# Patient Record
Sex: Male | Born: 1965 | Race: Black or African American | Hispanic: No | Marital: Married | Smoking: Current every day smoker
Health system: Southern US, Community
[De-identification: ages and names within clinical notes are randomized; demographics above are authoritative.]

## PROBLEM LIST (undated history)

## (undated) DIAGNOSIS — E7800 Pure hypercholesterolemia, unspecified: Secondary | ICD-10-CM

## (undated) DIAGNOSIS — F17200 Nicotine dependence, unspecified, uncomplicated: Secondary | ICD-10-CM

## (undated) HISTORY — DX: Nicotine dependence, unspecified, uncomplicated: F17.200

## (undated) HISTORY — PX: APPENDECTOMY: SHX54

## (undated) HISTORY — PX: JOINT REPLACEMENT: SHX530

---

## 2000-11-05 ENCOUNTER — Encounter: Payer: Self-pay | Admitting: Emergency Medicine

## 2000-11-05 ENCOUNTER — Observation Stay (HOSPITAL_COMMUNITY): Admission: AC | Admit: 2000-11-05 | Discharge: 2000-11-06 | Payer: Self-pay | Admitting: *Deleted

## 2001-04-24 ENCOUNTER — Encounter: Payer: Self-pay | Admitting: Orthopedic Surgery

## 2001-04-30 ENCOUNTER — Inpatient Hospital Stay (HOSPITAL_COMMUNITY): Admission: RE | Admit: 2001-04-30 | Discharge: 2001-05-02 | Payer: Self-pay | Admitting: Orthopedic Surgery

## 2016-03-25 ENCOUNTER — Other Ambulatory Visit (HOSPITAL_COMMUNITY): Payer: Self-pay | Admitting: Orthopedic Surgery

## 2016-03-25 DIAGNOSIS — M25552 Pain in left hip: Secondary | ICD-10-CM

## 2016-04-13 ENCOUNTER — Encounter (HOSPITAL_COMMUNITY): Payer: 59

## 2017-09-12 ENCOUNTER — Other Ambulatory Visit: Payer: Self-pay | Admitting: Orthopedic Surgery

## 2017-09-26 NOTE — Pre-Procedure Instructions (Signed)
Peter Ramsey  09/26/2017      [id number] - [place], [address] AT [place] [address] Phone: [phone] Fax: [phone]    Your procedure is scheduled on October 05, 2017.  Report to [facility] Admitting at 745 AM.  Call this number if you have problems the morning of surgery:  [phone]   Remember:  Do not eat or drink after midnight.    Take these medicines the morning of surgery with A SIP OF WATER  Tramadol (ultram)- if needed  7 days prior to surgery STOP taking any meloxicam (mobic) , Aspirin (unless otherwise instructed by your surgeon), Aleve, Naproxen, Ibuprofen, Motrin, Advil, Goody's, BC's, all herbal medications, fish oil, and all vitamins    Do not wear jewelry  Do not wear lotions, powders, or colognes, or deodorant.  Men may shave face and neck.  Do not bring valuables to the hospital.  [facility] is not responsible for any belongings or valuables.  Contacts, dentures or bridgework may not be worn into surgery.  Leave your suitcase in the car.  After surgery it may be brought to your room.  For patients admitted to the hospital, discharge time will be determined by your treatment team.  Patients discharged the day of surgery will not be allowed to drive home.    [facility]- Preparing For Surgery  Before surgery, you can play an important role. Because skin is not sterile, your skin needs to be as free of germs as possible. You can reduce the number of germs on your skin by washing with CHG (chlorahexidine gluconate) Soap before surgery.  CHG is an antiseptic cleaner which kills germs and bonds with the skin to continue killing germs even after washing.    Oral Hygiene is also important to reduce your risk of infection.  Remember - BRUSH YOUR TEETH THE MORNING OF SURGERY WITH YOUR REGULAR TOOTHPASTE  Please do not use if you have an allergy to  CHG or antibacterial soaps. If your skin becomes reddened/irritated stop using the CHG.  Do not shave (including legs and underarms) for at least 48 hours prior to first CHG shower. It is OK to shave your face.  Please follow these instructions carefully.   1. Shower the NIGHT BEFORE SURGERY and the MORNING OF SURGERY with CHG.   2. If you chose to wash your hair, wash your hair first as usual with your normal shampoo.  3. After you shampoo, rinse your hair and body thoroughly to remove the shampoo.  4. Use CHG as you would any other liquid soap. You can apply CHG directly to the skin and wash gently with a scrungie or a clean washcloth.   5. Apply the CHG Soap to your body ONLY FROM THE NECK DOWN.  Do not use on open wounds or open sores. Avoid contact with your eyes, ears, mouth and genitals (private parts). Wash Face and genitals (private parts)  with your normal soap.  6. Wash thoroughly, paying special attention to the area where your surgery will be performed.  7. Thoroughly rinse your body with warm water from the neck down.  8. DO NOT shower/wash with your normal soap after using and rinsing off the CHG Soap.  9. Pat yourself dry with a CLEAN TOWEL.  10. Wear CLEAN PAJAMAS to bed the night before surgery, wear comfortable clothes the morning of surgery  11. Place  CLEAN SHEETS on your bed the night of your first shower and DO NOT SLEEP WITH PETS.  Day of Surgery:  Do not apply any deodorants/lotions.  Please wear clean clothes to the hospital/surgery center.   Remember to brush your teeth WITH YOUR REGULAR TOOTHPASTE.   Please read over the following fact sheets that you were given.

## 2017-09-26 NOTE — Progress Notes (Addendum)
PCP: denies  Cardiologist: denies  EKG: denies within past year  Stress test:denies  ECHO: denies  Cardiac Cath: denies  Chest x-ray: denies within past year

## 2017-09-27 ENCOUNTER — Other Ambulatory Visit: Payer: Self-pay

## 2017-09-27 ENCOUNTER — Encounter (HOSPITAL_COMMUNITY)
Admission: RE | Admit: 2017-09-27 | Discharge: 2017-09-27 | Disposition: A | Discharge status: Home / Self Care | Payer: 59 | Source: Ambulatory Visit | Attending: Orthopedic Surgery | Admitting: Orthopedic Surgery

## 2017-09-27 ENCOUNTER — Encounter (HOSPITAL_COMMUNITY): Payer: Self-pay

## 2017-09-27 DIAGNOSIS — Z01818 Encounter for other preprocedural examination: Secondary | ICD-10-CM

## 2017-09-27 DIAGNOSIS — F17200 Nicotine dependence, unspecified, uncomplicated: Secondary | ICD-10-CM | POA: Diagnosis not present

## 2017-09-27 DIAGNOSIS — Z01812 Encounter for preprocedural laboratory examination: Secondary | ICD-10-CM | POA: Diagnosis present

## 2017-09-27 DIAGNOSIS — Z01810 Encounter for preprocedural cardiovascular examination: Secondary | ICD-10-CM | POA: Diagnosis present

## 2017-09-27 DIAGNOSIS — R9431 Abnormal electrocardiogram [ECG] [EKG]: Secondary | ICD-10-CM | POA: Diagnosis not present

## 2017-09-27 HISTORY — DX: Pure hypercholesterolemia, unspecified: E78.00

## 2017-09-27 LAB — CBC WITH DIFFERENTIAL/PLATELET
Abs Immature Granulocytes: 0 10*3/uL (ref 0.0–0.1)
BASOS ABS: 0 10*3/uL (ref 0.0–0.1)
BASOS PCT: 0 %
EOS PCT: 2 %
Eosinophils Absolute: 0.1 10*3/uL (ref 0.0–0.7)
HCT: 37.8 % — ABNORMAL LOW (ref 39.0–52.0)
Hemoglobin: 12.8 g/dL — ABNORMAL LOW (ref 13.0–17.0)
Immature Granulocytes: 0 %
Lymphocytes Relative: 32 %
Lymphs Abs: 1.5 10*3/uL (ref 0.7–4.0)
MCH: 34.5 pg — AB (ref 26.0–34.0)
MCHC: 33.9 g/dL (ref 30.0–36.0)
MCV: 101.9 fL — ABNORMAL HIGH (ref 78.0–100.0)
Monocytes Absolute: 0.4 10*3/uL (ref 0.1–1.0)
Monocytes Relative: 8 %
Neutro Abs: 2.7 10*3/uL (ref 1.7–7.7)
Neutrophils Relative %: 58 %
PLATELETS: 278 10*3/uL (ref 150–400)
RBC: 3.71 MIL/uL — ABNORMAL LOW (ref 4.22–5.81)
RDW: 11.1 % — ABNORMAL LOW (ref 11.5–15.5)
WBC: 4.6 10*3/uL (ref 4.0–10.5)

## 2017-09-27 LAB — TYPE AND SCREEN
ABO/RH(D): AB POS
Antibody Screen: NEGATIVE

## 2017-09-27 LAB — COMPREHENSIVE METABOLIC PANEL
ALT: 19 U/L (ref 0–44)
ANION GAP: 12 (ref 5–15)
AST: 31 U/L (ref 15–41)
Albumin: 4.3 g/dL (ref 3.5–5.0)
Alkaline Phosphatase: 83 U/L (ref 38–126)
BUN: 5 mg/dL — ABNORMAL LOW (ref 6–20)
CHLORIDE: 102 mmol/L (ref 98–111)
CO2: 23 mmol/L (ref 22–32)
Calcium: 9.9 mg/dL (ref 8.9–10.3)
Creatinine, Ser: 0.92 mg/dL (ref 0.61–1.24)
GFR calc non Af Amer: >60 mL/min (ref >60)
Glucose, Bld: 95 mg/dL (ref 70–99)
Potassium: 4.1 mmol/L (ref 3.5–5.1)
SODIUM: 137 mmol/L (ref 135–145)
Total Bilirubin: 0.7 mg/dL (ref 0.3–1.2)
Total Protein: 7.4 g/dL (ref 6.5–8.1)

## 2017-09-27 LAB — URINALYSIS, ROUTINE W REFLEX MICROSCOPIC
Bilirubin Urine: NEGATIVE
GLUCOSE, UA: NEGATIVE mg/dL
HGB URINE DIPSTICK: NEGATIVE
Ketones, ur: NEGATIVE mg/dL
Leukocytes, UA: NEGATIVE
Nitrite: NEGATIVE
PH: 5 (ref 5.0–8.0)
PROTEIN: NEGATIVE mg/dL
SPECIFIC GRAVITY, URINE: 1.009 (ref 1.005–1.030)

## 2017-09-27 LAB — PROTIME-INR
INR: 0.94
PROTHROMBIN TIME: 12.5 s (ref 11.4–15.2)

## 2017-09-27 LAB — SURGICAL PCR SCREEN
MRSA, PCR: NEGATIVE
STAPHYLOCOCCUS AUREUS: NEGATIVE

## 2017-09-27 LAB — ABO/RH: ABO/RH(D): AB POS

## 2017-09-27 LAB — APTT: aPTT: 28 seconds (ref 24–36)

## 2017-10-04 MED ORDER — TRANEXAMIC ACID 1000 MG/10ML IV SOLN
1000.0000 mg | INTRAVENOUS | Status: AC
  Administered 2017-10-05: 1000 mg via INTRAVENOUS
  Filled 2017-10-04: qty 1000

## 2017-10-05 ENCOUNTER — Inpatient Hospital Stay (HOSPITAL_COMMUNITY)
Admission: RE | Admit: 2017-10-05 | Discharge: 2017-10-06 | DRG: 483 | Disposition: A | Discharge status: Home / Self Care | Payer: 59 | Source: Ambulatory Visit | Attending: Orthopedic Surgery | Admitting: Orthopedic Surgery

## 2017-10-05 ENCOUNTER — Other Ambulatory Visit: Payer: Self-pay

## 2017-10-05 ENCOUNTER — Encounter (HOSPITAL_COMMUNITY): Payer: Self-pay | Admitting: Anesthesiology

## 2017-10-05 ENCOUNTER — Inpatient Hospital Stay (HOSPITAL_COMMUNITY): Payer: 59 | Admitting: Anesthesiology

## 2017-10-05 ENCOUNTER — Inpatient Hospital Stay (HOSPITAL_COMMUNITY): Payer: 59

## 2017-10-05 ENCOUNTER — Encounter (HOSPITAL_COMMUNITY)
Admission: RE | Disposition: A | Discharge status: Home / Self Care | Payer: Self-pay | Source: Ambulatory Visit | Attending: Orthopedic Surgery

## 2017-10-05 DIAGNOSIS — Z791 Long term (current) use of non-steroidal anti-inflammatories (NSAID): Secondary | ICD-10-CM | POA: Diagnosis not present

## 2017-10-05 DIAGNOSIS — F17210 Nicotine dependence, cigarettes, uncomplicated: Secondary | ICD-10-CM | POA: Diagnosis present

## 2017-10-05 DIAGNOSIS — M25512 Pain in left shoulder: Secondary | ICD-10-CM | POA: Diagnosis not present

## 2017-10-05 DIAGNOSIS — Z96612 Presence of left artificial shoulder joint: Secondary | ICD-10-CM

## 2017-10-05 DIAGNOSIS — E7800 Pure hypercholesterolemia, unspecified: Secondary | ICD-10-CM | POA: Diagnosis present

## 2017-10-05 DIAGNOSIS — M879 Osteonecrosis, unspecified: Secondary | ICD-10-CM | POA: Diagnosis not present

## 2017-10-05 HISTORY — PX: SHOULDER HEMI-ARTHROPLASTY: SHX5049

## 2017-10-05 SURGERY — HEMIARTHROPLASTY, SHOULDER
Anesthesia: General | Site: Shoulder | Laterality: Left

## 2017-10-05 MED ORDER — CEFAZOLIN SODIUM-DEXTROSE 2-4 GM/100ML-% IV SOLN
2.0000 g | Freq: Four times a day (QID) | INTRAVENOUS | Status: AC
  Administered 2017-10-05 – 2017-10-06 (×3): 2 g via INTRAVENOUS
  Filled 2017-10-05 (×4): qty 100

## 2017-10-05 MED ORDER — MENTHOL 3 MG MT LOZG: 1.0000 | LOZENGE | OROMUCOSAL | Status: DC | PRN

## 2017-10-05 MED ORDER — SUCCINYLCHOLINE CHLORIDE 20 MG/ML IJ SOLN
INTRAMUSCULAR | Status: DC | PRN
  Administered 2017-10-05: 100 mg via INTRAVENOUS

## 2017-10-05 MED ORDER — BUPIVACAINE LIPOSOME 1.3 % IJ SUSP
INTRAMUSCULAR | Status: DC | PRN
  Administered 2017-10-05: 10 mL via PERINEURAL

## 2017-10-05 MED ORDER — OXYCODONE HCL 5 MG/5ML PO SOLN: 5.0000 mg | Freq: Once | ORAL | Status: DC | PRN

## 2017-10-05 MED ORDER — ONDANSETRON HCL 4 MG/2ML IJ SOLN
INTRAMUSCULAR | Status: AC
  Filled 2017-10-05: qty 2

## 2017-10-05 MED ORDER — MEPERIDINE HCL 50 MG/ML IJ SOLN: 6.2500 mg | INTRAMUSCULAR | Status: DC | PRN

## 2017-10-05 MED ORDER — POLYETHYLENE GLYCOL 3350 17 G PO PACK: 17.0000 g | PACK | Freq: Every day | ORAL | Status: DC | PRN

## 2017-10-05 MED ORDER — DOCUSATE SODIUM 100 MG PO CAPS
100.0000 mg | ORAL_CAPSULE | Freq: Two times a day (BID) | ORAL | Status: DC
  Administered 2017-10-05 – 2017-10-06 (×3): 100 mg via ORAL
  Filled 2017-10-05 (×3): qty 1

## 2017-10-05 MED ORDER — METHOCARBAMOL 500 MG PO TABS
500.0000 mg | ORAL_TABLET | Freq: Four times a day (QID) | ORAL | Status: DC | PRN
  Administered 2017-10-05: 500 mg via ORAL
  Filled 2017-10-05: qty 1

## 2017-10-05 MED ORDER — METHOCARBAMOL 1000 MG/10ML IJ SOLN
500.0000 mg | Freq: Four times a day (QID) | INTRAVENOUS | Status: DC | PRN
  Filled 2017-10-05: qty 5

## 2017-10-05 MED ORDER — PROMETHAZINE HCL 25 MG/ML IJ SOLN: 6.2500 mg | INTRAMUSCULAR | Status: DC | PRN

## 2017-10-05 MED ORDER — DEXAMETHASONE SODIUM PHOSPHATE 4 MG/ML IJ SOLN
INTRAMUSCULAR | Status: DC | PRN
  Administered 2017-10-05: 10 mg via INTRAVENOUS

## 2017-10-05 MED ORDER — PHENOL 1.4 % MT LIQD: 1.0000 | OROMUCOSAL | Status: DC | PRN

## 2017-10-05 MED ORDER — MIDAZOLAM HCL 2 MG/2ML IJ SOLN
INTRAMUSCULAR | Status: AC
  Administered 2017-10-05: 2 mg via INTRAVENOUS
  Filled 2017-10-05: qty 2

## 2017-10-05 MED ORDER — LACTATED RINGERS IV SOLN
INTRAVENOUS | Status: DC
  Administered 2017-10-05: 08:00:00 via INTRAVENOUS

## 2017-10-05 MED ORDER — METOCLOPRAMIDE HCL 5 MG/ML IJ SOLN: 5.0000 mg | Freq: Three times a day (TID) | INTRAMUSCULAR | Status: DC | PRN

## 2017-10-05 MED ORDER — ASPIRIN EC 81 MG PO TBEC
81.0000 mg | DELAYED_RELEASE_TABLET | Freq: Two times a day (BID) | ORAL | Status: DC
  Administered 2017-10-05 – 2017-10-06 (×2): 81 mg via ORAL
  Filled 2017-10-05 (×2): qty 1

## 2017-10-05 MED ORDER — EPHEDRINE SULFATE 50 MG/ML IJ SOLN
INTRAMUSCULAR | Status: DC | PRN
  Administered 2017-10-05 (×2): 5 mg via INTRAVENOUS

## 2017-10-05 MED ORDER — ACETAMINOPHEN 500 MG PO TABS
1000.0000 mg | ORAL_TABLET | Freq: Four times a day (QID) | ORAL | Status: AC
  Administered 2017-10-05 – 2017-10-06 (×4): 1000 mg via ORAL
  Filled 2017-10-05 (×4): qty 2

## 2017-10-05 MED ORDER — OXYCODONE HCL 5 MG PO TABS: 5.0000 mg | ORAL_TABLET | Freq: Once | ORAL | Status: DC | PRN

## 2017-10-05 MED ORDER — HYDROMORPHONE HCL 1 MG/ML IJ SOLN
0.5000 mg | INTRAMUSCULAR | Status: DC | PRN
  Administered 2017-10-05: 1 mg via INTRAVENOUS
  Filled 2017-10-05: qty 1

## 2017-10-05 MED ORDER — PROPOFOL 10 MG/ML IV BOLUS
INTRAVENOUS | Status: DC | PRN
  Administered 2017-10-05: 200 mg via INTRAVENOUS

## 2017-10-05 MED ORDER — SODIUM CHLORIDE 0.9 % IV SOLN
INTRAVENOUS | Status: DC
  Administered 2017-10-05: 14:00:00 via INTRAVENOUS

## 2017-10-05 MED ORDER — FENTANYL CITRATE (PF) 100 MCG/2ML IJ SOLN
100.0000 ug | Freq: Once | INTRAMUSCULAR | Status: AC
  Administered 2017-10-05: 100 ug via INTRAVENOUS

## 2017-10-05 MED ORDER — OXYCODONE HCL 5 MG PO TABS: 10.0000 mg | ORAL_TABLET | ORAL | Status: DC | PRN

## 2017-10-05 MED ORDER — PHENYLEPHRINE HCL 10 MG/ML IJ SOLN
INTRAMUSCULAR | Status: DC | PRN
  Administered 2017-10-05 (×3): 80 ug via INTRAVENOUS

## 2017-10-05 MED ORDER — HYDROMORPHONE HCL 1 MG/ML IJ SOLN: 0.2500 mg | INTRAMUSCULAR | Status: DC | PRN

## 2017-10-05 MED ORDER — OXYCODONE HCL 5 MG PO TABS
5.0000 mg | ORAL_TABLET | ORAL | Status: DC | PRN
  Administered 2017-10-05 – 2017-10-06 (×3): 10 mg via ORAL
  Filled 2017-10-05 (×3): qty 2

## 2017-10-05 MED ORDER — ALUM & MAG HYDROXIDE-SIMETH 200-200-20 MG/5ML PO SUSP: 30.0000 mL | ORAL | Status: DC | PRN

## 2017-10-05 MED ORDER — POVIDONE-IODINE 7.5 % EX SOLN
Freq: Once | CUTANEOUS | Status: DC
  Filled 2017-10-05: qty 118

## 2017-10-05 MED ORDER — METOCLOPRAMIDE HCL 5 MG PO TABS: 5.0000 mg | ORAL_TABLET | Freq: Three times a day (TID) | ORAL | Status: DC | PRN

## 2017-10-05 MED ORDER — ONDANSETRON HCL 4 MG/2ML IJ SOLN: 4.0000 mg | Freq: Four times a day (QID) | INTRAMUSCULAR | Status: DC | PRN

## 2017-10-05 MED ORDER — SODIUM CHLORIDE 0.9 % IR SOLN
Status: DC | PRN
  Administered 2017-10-05: 1000 mL

## 2017-10-05 MED ORDER — ONDANSETRON HCL 4 MG PO TABS: 4.0000 mg | ORAL_TABLET | Freq: Four times a day (QID) | ORAL | Status: DC | PRN

## 2017-10-05 MED ORDER — FENTANYL CITRATE (PF) 100 MCG/2ML IJ SOLN
INTRAMUSCULAR | Status: AC
  Administered 2017-10-05: 100 ug via INTRAVENOUS
  Filled 2017-10-05: qty 2

## 2017-10-05 MED ORDER — BISACODYL 5 MG PO TBEC
5.0000 mg | DELAYED_RELEASE_TABLET | Freq: Every day | ORAL | Status: DC | PRN
  Filled 2017-10-05: qty 1

## 2017-10-05 MED ORDER — ACETAMINOPHEN 325 MG PO TABS: 325.0000 mg | ORAL_TABLET | Freq: Four times a day (QID) | ORAL | Status: DC | PRN

## 2017-10-05 MED ORDER — MIDAZOLAM HCL 2 MG/2ML IJ SOLN
2.0000 mg | Freq: Once | INTRAMUSCULAR | Status: AC
  Administered 2017-10-05: 2 mg via INTRAVENOUS

## 2017-10-05 MED ORDER — DIPHENHYDRAMINE HCL 12.5 MG/5ML PO ELIX: 12.5000 mg | ORAL_SOLUTION | ORAL | Status: DC | PRN

## 2017-10-05 MED ORDER — ONDANSETRON HCL 4 MG/2ML IJ SOLN
INTRAMUSCULAR | Status: DC | PRN
  Administered 2017-10-05: 4 mg via INTRAVENOUS

## 2017-10-05 MED ORDER — ZOLPIDEM TARTRATE 5 MG PO TABS: 5.0000 mg | ORAL_TABLET | Freq: Every evening | ORAL | Status: DC | PRN

## 2017-10-05 MED ORDER — FLEET ENEMA 7-19 GM/118ML RE ENEM: 1.0000 | ENEMA | Freq: Once | RECTAL | Status: DC | PRN

## 2017-10-05 MED ORDER — LIDOCAINE 2% (20 MG/ML) 5 ML SYRINGE
INTRAMUSCULAR | Status: DC | PRN
  Administered 2017-10-05: 60 mg via INTRAVENOUS

## 2017-10-05 MED ORDER — BUPIVACAINE HCL (PF) 0.5 % IJ SOLN
INTRAMUSCULAR | Status: DC | PRN
  Administered 2017-10-05: 20 mL via PERINEURAL

## 2017-10-05 MED ORDER — CEFAZOLIN SODIUM-DEXTROSE 2-4 GM/100ML-% IV SOLN
2.0000 g | INTRAVENOUS | Status: AC
  Administered 2017-10-05: 2 g via INTRAVENOUS
  Filled 2017-10-05: qty 100

## 2017-10-05 MED ORDER — DEXAMETHASONE SODIUM PHOSPHATE 10 MG/ML IJ SOLN
INTRAMUSCULAR | Status: AC
  Filled 2017-10-05: qty 1

## 2017-10-05 SURGICAL SUPPLY — 68 items
BLADE SAW SGTL 73X25 THK (BLADE) IMPLANT
BLADE SURG 15 STRL LF DISP TIS (BLADE) ×2 IMPLANT
BLADE SURG 15 STRL SS (BLADE) ×4
BOWL SMART MIX CTS (DISPOSABLE) IMPLANT
CHLORAPREP W/TINT 26ML (MISCELLANEOUS) ×4 IMPLANT
COVER SURGICAL LIGHT HANDLE (MISCELLANEOUS) ×2 IMPLANT
DRAPE IMP U-DRAPE 54X76 (DRAPES) ×4 IMPLANT
DRAPE INCISE IOBAN 66X45 STRL (DRAPES) ×2 IMPLANT
DRAPE ORTHO SPLIT 77X108 STRL (DRAPES) ×4
DRAPE SURG 17X23 STRL (DRAPES) ×2 IMPLANT
DRAPE SURG ORHT 6 SPLT 77X108 (DRAPES) ×2 IMPLANT
DRAPE U-SHAPE 47X51 STRL (DRAPES) ×2 IMPLANT
DRSG MEPILEX BORDER 4X4 (GAUZE/BANDAGES/DRESSINGS) ×2 IMPLANT
DRSG MEPILEX BORDER 4X8 (GAUZE/BANDAGES/DRESSINGS) ×2 IMPLANT
DRSG PAD ABDOMINAL 8X10 ST (GAUZE/BANDAGES/DRESSINGS) IMPLANT
ELECT BLADE 4.0 EZ CLEAN MEGAD (MISCELLANEOUS)
ELECT CAUTERY BLADE 6.4 (BLADE) ×2 IMPLANT
ELECT REM PT RETURN 9FT ADLT (ELECTROSURGICAL) ×2
ELECTRODE BLDE 4.0 EZ CLN MEGD (MISCELLANEOUS) IMPLANT
ELECTRODE REM PT RTRN 9FT ADLT (ELECTROSURGICAL) ×1 IMPLANT
EVACUATOR 1/8 PVC DRAIN (DRAIN) ×2 IMPLANT
GAUZE SPONGE 4X4 12PLY STRL (GAUZE/BANDAGES/DRESSINGS) ×2 IMPLANT
GLOVE BIO SURGEON STRL SZ7 (GLOVE) ×2 IMPLANT
GLOVE BIO SURGEON STRL SZ7.5 (GLOVE) ×2 IMPLANT
GLOVE BIOGEL PI IND STRL 8 (GLOVE) ×1 IMPLANT
GLOVE BIOGEL PI INDICATOR 8 (GLOVE) ×1
GOWN STRL REUS W/ TWL LRG LVL3 (GOWN DISPOSABLE) ×3 IMPLANT
GOWN STRL REUS W/TWL LRG LVL3 (GOWN DISPOSABLE) ×6
HANDPIECE INTERPULSE COAX TIP (DISPOSABLE) ×2
HEAD HUMERAL AEQUALIS 48X18 (Head) ×2 IMPLANT
HEAD HUMERAL HIGH OS 48X18 (Head) IMPLANT
HEMOSTAT SURGICEL 2X14 (HEMOSTASIS) IMPLANT
HOOD PEEL AWAY FACE SHEILD DIS (HOOD) ×4 IMPLANT
HUMERAL STEM AEQUALIS 3X74 (Shoulder) ×2 IMPLANT
KIT BASIN OR (CUSTOM PROCEDURE TRAY) ×2 IMPLANT
KIT TURNOVER KIT B (KITS) ×2 IMPLANT
MANIFOLD NEPTUNE II (INSTRUMENTS) ×2 IMPLANT
NDL HYPO 25GX1X1/2 BEV (NEEDLE) IMPLANT
NDL MAYO TROCAR (NEEDLE) ×1 IMPLANT
NEEDLE HYPO 25GX1X1/2 BEV (NEEDLE) IMPLANT
NEEDLE MAYO TROCAR (NEEDLE) ×2 IMPLANT
NS IRRIG 1000ML POUR BTL (IV SOLUTION) ×2 IMPLANT
PACK SHOULDER (CUSTOM PROCEDURE TRAY) ×2 IMPLANT
PACK UNIVERSAL I (CUSTOM PROCEDURE TRAY) ×2 IMPLANT
PAD ARMBOARD 7.5X6 YLW CONV (MISCELLANEOUS) ×4 IMPLANT
RETRIEVER SUT HEWSON (MISCELLANEOUS) IMPLANT
SET HNDPC FAN SPRY TIP SCT (DISPOSABLE) ×1 IMPLANT
SLING ARM IMMOBILIZER LRG (SOFTGOODS) ×2 IMPLANT
SLING ARM IMMOBILIZER MED (SOFTGOODS) IMPLANT
SPONGE LAP 18X18 X RAY DECT (DISPOSABLE) ×2 IMPLANT
STEM HUMERAL AEQUALIS 3X74 (Shoulder) IMPLANT
STRIP CLOSURE SKIN 1/2X4 (GAUZE/BANDAGES/DRESSINGS) ×2 IMPLANT
SUCTION FRAZIER HANDLE 10FR (MISCELLANEOUS) ×1
SUCTION TUBE FRAZIER 10FR DISP (MISCELLANEOUS) ×1 IMPLANT
SUPPORT WRAP ARM LG (MISCELLANEOUS) ×2 IMPLANT
SUT ETHIBOND NAB CT1 #1 30IN (SUTURE) ×8 IMPLANT
SUT FIBERWIRE #2 38 T-5 BLUE (SUTURE) ×4
SUT MNCRL AB 4-0 PS2 18 (SUTURE) ×2 IMPLANT
SUT VIC AB 0 CTB1 27 (SUTURE) IMPLANT
SUT VIC AB 2-0 CT1 27 (SUTURE) ×4
SUT VIC AB 2-0 CT1 TAPERPNT 27 (SUTURE) ×2 IMPLANT
SUTURE FIBERWR #2 38 T-5 BLUE (SUTURE) ×2 IMPLANT
SYR CONTROL 10ML LL (SYRINGE) IMPLANT
TAPE FIBER 2MM 7IN #2 BLUE (SUTURE) ×6 IMPLANT
TOWEL OR 17X24 6PK STRL BLUE (TOWEL DISPOSABLE) ×2 IMPLANT
TOWEL OR 17X26 10 PK STRL BLUE (TOWEL DISPOSABLE) ×2 IMPLANT
TRAY FOLEY MTR SLVR 16FR STAT (SET/KITS/TRAYS/PACK) IMPLANT
WATER STERILE IRR 1000ML POUR (IV SOLUTION) ×2 IMPLANT

## 2017-10-05 NOTE — H&P (Signed)
Peter Ramsey is an 52 y.o. male.   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  Pain interferes with sleep and quality of life.   Past Medical History:  Diagnosis Date  . Hypercholesteremia     Past Surgical History:  Procedure Laterality Date  . APPENDECTOMY     as a child  . JOINT REPLACEMENT Left    2002 Left Hip    History reviewed. No pertinent family history. Social History:  reports that he has been smoking cigarettes. He has been smoking about 0.25 packs per day. He has never used smokeless tobacco. He reports that he drinks about 1.0 - 2.0 standard drinks of alcohol per week. He reports that he has current or past drug history. Drug: Marijuana.  Allergies: No Known Allergies  Medications Prior to Admission  Medication Sig Dispense Refill  . Cholecalciferol (VITAMIN D3) 2000 units TABS Take 2,000 Units by mouth daily.    . meloxicam (MOBIC) 15 MG tablet Take 7.5-15 mg by mouth daily as needed (for inflammation).   6  . Multiple Vitamin (ONE-A-DAY MENS PO) Take 1 tablet by mouth daily.    . traMADol (ULTRAM) 50 MG tablet Take 50 mg by mouth every 6 (six) hours as needed for moderate pain.  3    No results found for this or any previous visit (from the past 48 hour(s)). No results found.  Review of Systems  All other systems reviewed and are negative.   Blood pressure 114/89, pulse 88, temperature 98.7 F (37.1 C), temperature source Oral, resp. rate 13, height 6\' 5"  (1.956 m), weight 65.5 kg, SpO2 100 %. Physical Exam  Constitutional: He is oriented to person, place, and time. He appears well-developed and well-nourished.  HENT:  Head: Atraumatic.  Eyes: EOM are normal.  Cardiovascular: Intact distal pulses.  Respiratory: Effort normal.  Musculoskeletal:  L shoulder pain with limited ROM.NVID  Neurological: He is alert and oriented to person, place, and time.  Skin: Skin  is warm and dry.  Psychiatric: He has a normal mood and affect.     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  Pain interferes with sleep and quality of life. Plan L shoulder hemiarthoplasty Risks / benefits of surgery discussed Consent on chart  NPO for OR Preop antibiotics   Isabella Stalling, MD 10/05/2017, 9:12 AM

## 2017-10-05 NOTE — Anesthesia Procedure Notes (Signed)
Procedure Name: Intubation Date/Time: 10/05/2017 9:52 AM Performed by: Lieutenant Diego, CRNA Pre-anesthesia Checklist: Patient identified, Emergency Drugs available, Suction available, Patient being monitored and Timeout performed Patient Re-evaluated:Patient Re-evaluated prior to induction Oxygen Delivery Method: Circle system utilized Preoxygenation: Pre-oxygenation with 100% oxygen Induction Type: IV induction Ventilation: Mask ventilation without difficulty Laryngoscope Size: Miller and 3 Grade View: Grade I Tube type: Oral Tube size: 7.5 mm Number of attempts: 1 Airway Equipment and Method: Stylet Placement Confirmation: ETT inserted through vocal cords under direct vision,  positive ETCO2,  CO2 detector and breath sounds checked- equal and bilateral Secured at: 23 cm Tube secured with: Tape

## 2017-10-05 NOTE — Transfer of Care (Signed)
Immediate Anesthesia Transfer of Care Note  Patient: Peter Ramsey  Procedure(s) Performed: SHOULDER HEMI-ARTHROPLASTY (Left Shoulder)  Patient Location: PACU  Anesthesia Type:GA combined with regional for post-op pain  Level of Consciousness: awake, oriented and patient cooperative  Airway & Oxygen Therapy: Patient Spontanous Breathing and Patient connected to nasal cannula oxygen  Post-op Assessment: Report given to RN, Post -op Vital signs reviewed and stable and Patient moving all extremities  Post vital signs: Reviewed and stable  Last Vitals:  Vitals Value Taken Time  BP 137/82 10/05/2017 11:40 AM  Temp    Pulse 82 10/05/2017 11:41 AM  Resp 20 10/05/2017 11:41 AM  SpO2 100 % 10/05/2017 11:41 AM  Vitals shown include unvalidated device data.  Last Pain:  Vitals:   10/05/17 0900  TempSrc:   PainSc: 0-No pain         Complications: No apparent anesthesia complications

## 2017-10-05 NOTE — Progress Notes (Signed)
Transfer from PACU  Pt received from PACU alert and oriented. Pt sitting in recliner. Pt states his pain is a 2/10 and he is comfotable with that. Pt states he wants his pain to remain below 5 with a pain goal of 0. Pt instructed to call for pain medication when his pain reaches a 5. Pt explained that his pain goal may take a few days to get too, but he will be comfortable.   Orders released. Will monitor pt for clinical changes.

## 2017-10-05 NOTE — Anesthesia Preprocedure Evaluation (Signed)
Anesthesia Evaluation  Patient identified by MRN, date of birth, ID band Patient awake    Reviewed: Allergy & Precautions, NPO status , Patient's Chart, lab work & pertinent test results  Airway Mallampati: II  TM Distance: >3 FB Neck ROM: Full    Dental no notable dental hx.    Pulmonary neg pulmonary ROS, Current Smoker,    Pulmonary exam normal breath sounds clear to auscultation       Cardiovascular negative cardio ROS Normal cardiovascular exam Rhythm:Regular Rate:Normal     Neuro/Psych negative neurological ROS  negative psych ROS   GI/Hepatic negative GI ROS, Neg liver ROS,   Endo/Other  negative endocrine ROS  Renal/GU negative Renal ROS  negative genitourinary   Musculoskeletal negative musculoskeletal ROS (+)   Abdominal   Peds negative pediatric ROS (+)  Hematology negative hematology ROS (+)   Anesthesia Other Findings   Reproductive/Obstetrics negative OB ROS                             Anesthesia Physical Anesthesia Plan  ASA: II  Anesthesia Plan: General   Post-op Pain Management: GA combined w/ Regional for post-op pain   Induction: Intravenous  PONV Risk Score and Plan: 1 and Ondansetron  Airway Management Planned: Oral ETT  Additional Equipment:   Intra-op Plan:   Post-operative Plan: Extubation in OR  Informed Consent: I have reviewed the patients History and Physical, chart, labs and discussed the procedure including the risks, benefits and alternatives for the proposed anesthesia with the patient or authorized representative who has indicated his/her understanding and acceptance.   Dental advisory given  Plan Discussed with: CRNA  Anesthesia Plan Comments:         Anesthesia Quick Evaluation

## 2017-10-05 NOTE — Discharge Instructions (Signed)
Discharge Instructions after Shoulder Arthroplasty ° ° °A sling has been provided for you. Remove the sling 5 times each day to perform motion exercises. °Use ice on the shoulder intermittently over the first 48 hours after surgery.  °Pain medication has been prescribed for you.  °Use your medication liberally over the first 48 hours, and then begin to taper your use. You may take Extra Strength Tylenol or Tylenol only in place of the pain pills. DO NOT take ANY nonsteroidal anti-inflammatory pain medications: Advil, Motrin, Ibuprofen, Aleve, Naproxen, or Naprosyn. °Take one aspirin a day for 2 weeks after surgery, unless you have an aspirin sensitivity/allergy or asthma. °Leave your dressing on until your first follow up visit.  You may shower with the dressing.  Hold your arm as if you still have your sling on while you shower. °Active reaching and lifting are not permitted. You may use the operative arm for activities of daily living that do not require the operative arm to leave the side of the body, such as eating, drinking, bathing, etc.  °Three to 5 times each day you should perform assisted overhead reaching and external rotation (outward turning) exercises with the operative arm. You were taught these exercises prior to discharge. Both exercises should be done with the non-operative arm used as the "therapist arm" while the operative arm remains relaxed. Ten of each exercise should be done three to five times each day. ° ° °Overhead reach is helping to lift your stiff arm up as high as it will go. To stretch your overhead reach, lie flat on your back, relax, and grasp the wrist of the tight shoulder with your opposite hand. Using the power in your opposite arm, bring the stiff arm up as far as it is comfortable. Start holding it for ten seconds and then work up to where you can hold it for a count of 30. Breathe slowly and deeply while the arm is moved. Repeat this stretch ten times, trying to help the ar  up a little higher each time.  ° ° ° °External rotation is turning the arm out to the side while your elbow stays close to your body. External rotation is best stretched while you are lying on your back. Hold a cane, yardstick, broom handle, or dowel in both hands. Bend both elbows to a right angle. Use steady, gentle force from your normal arm to rotate the hand of the stiff shoulder out away from your body. Continue the rotation as far as it will go comfortably, holding it there for a count of 10. Repeat this exercise ten times.  ° ° ° ° °Please call [phone] during normal business hours or [phone] after hours for any problems. Including the following: ° °- excessive redness of the incisions °- drainage for more than 4 days °- fever of more than 101.5 F ° °*Please note that pain medications will not be refilled after hours or on weekends. ° ° ° ° °

## 2017-10-05 NOTE — Op Note (Signed)
Procedure(s): SHOULDER HEMI-ARTHROPLASTY Procedure Note  Peter Ramsey male 52 y.o. 10/05/2017  Procedure(s) and Anesthesia Type:    * LEFT SHOULDER HEMI-ARTHROPLASTY - Choice  Surgeon(s) and Role:    Tania Ade, MD - Primary   Indications:  52 y.o. male  With endstage left shoulder AVN. Pain and dysfunction interfered with quality of life and nonoperative treatment with activity modification, NSAIDS and injections failed.     Surgeon: Isabella Stalling   Assistants: Jeanmarie Hubert PA-C [facility] was present and scrubbed throughout the procedure and was essential in positioning, retraction, exposure, and closure)  Anesthesia: General endotracheal anesthesia   Procedure Detail  SHOULDER HEMI-ARTHROPLASTY  Findings: Tornier flex anatomic press-fit size 3 stem with a 48 head.   A lesser tuberosity osteotomy was performed and repaired at the conclusion of the procedure.  Estimated Blood Loss:  150         Drains: None   Blood Given: none          Specimens: none        Complications:  * No complications entered in OR log *         Disposition: PACU - hemodynamically stable.         Condition: stable    Procedure:   The patient was identified in the preoperative holding area where I personally marked the operative extremity after verifying with the patient and consent. He  was taken to the operating room where He was transferred to the   operative table.  The patient received an interscalene block in   the holding area by the attending anesthesiologist.  General anesthesia was induced   in the operating room without complication.  The patient did receive IV  Ancef prior to the commencement of the procedure.  The patient was   placed in the beach-chair position with the back raised about 30   degrees.  The nonoperative extremity and head and neck were carefully   positioned and padded protecting against neurovascular compromise.  The   left upper  extremity was then prepped and draped in the standard sterile   fashion.    The appropriate operative time-out was performed with   Anesthesia, the perioperative staff, as well as myself and we all agreed   that the left side was the correct operative site.  An approximately   10 cm incision was made from the tip of the coracoid to the center point of the   humerus at the level of the axilla.  Dissection was carried down sharply   through subcutaneous tissues and cephalic vein was identified and taken   laterally with the deltoid.  The pectoralis major was taken medially.  The   upper 1 cm of the pectoralis major was released from its attachment on   the humerus.  The clavipectoral fascia was incised just lateral to the   conjoined tendon.  This incision was carried up to but not into the   coracoacromial ligament.  Digital palpation was used to prove   integrity of the axillary nerve which was protected throughout the   procedure.  Musculocutaneous nerve was not palpated in the operative   field.  Conjoined tendon was then retracted gently medially and the   deltoid laterally.  Anterior circumflex humeral vessels were clamped and   coagulated.  The soft tissues overlying the biceps was incised and this   incision was carried across the transverse humeral ligament to the base   of the  coracoid.  The biceps was noted to be severely degenerated. It was released from the superior labrum. The biceps was then tenodesed to the soft tissue just above   pectoralis major and the remaining portion of the biceps superiorly was   excised.  An osteotomy was performed at the lesser tuberosity.  The capsule was then   released all the way down to the 6 o'clock position of the humeral head.   The humeral head was then delivered with simultaneous adduction,   extension and external rotation.  All humeral osteophytes were removed   and the anatomic neck of the humerus was marked and cut free hand at    approximately 25 degrees retroversion within about 3 mm of the cuff   reflection posteriorly.  The head size was estimated to be a 48 medium   offset.  At that point, the humeral head was retracted posteriorly with   a Fukuda retractor.    The remaining biceps anchor was excised.  The degenerated inner portion of the anterior and posterior labrum were excised.  The periphery was left intact.    The proximal humerus was then again exposed.    The entry awl was used followed by sounding reamers and then sequentially broached from size 1 to 3. This was then left in place and the calcar planer was used. Trial head was placed with a 48.  With the trial implantation of the component,  there was approximately 50% posterior translation with immediate snap back to the   anatomic position.  With forward elevation, there was no tendency   towards posterior subluxation.   The trial was removed and the final implant was prepared on a back table.  The trial was removed and the final implant was prepared on a back table.   3 small holes were drilled on the medial side of the lesser tuberosity osteotomy, through which 2 labral tapes were passed. The implant was then placed through the loop of the 2 labral tapes and impacted with an excellent press-fit. This achieved excellent anatomic reconstruction of the proximal humerus.  The joint was then copiously irrigated with pulse lavage.  The subscapularis and   lesser tuberosity osteotomy were then repaired using the 2 labral tapes previously passed in a double row fashion with horizontal mattress sutures medially brought over through bone tunnels tied over a bone bridge laterally.   One #1 Ethibond was placed at the rotator interval just above   the lesser tuberosity. Copious irrigation was used. Skin was closed with 2-0 Vicryl sutures in the deep dermal layer and 4-0 Monocryl in a subcuticular  running fashion.  Sterile dressings were then applied including Aquacel.  The  patient was placed in a sling and allowed to awaken from general anesthesia and taken to the recovery room in stable condition.      POSTOPERATIVE PLAN:  Early passive range of motion will be allowed with the goal of 0 degrees external rotation and 90 degrees forward elevation.  No internal rotation at this time.  No active motion of the arm until the lesser tuberosity heals.  The patient will likely be kept in the hospital for 1-2 days and then discharged home.

## 2017-10-05 NOTE — Anesthesia Procedure Notes (Signed)
Anesthesia Regional Block: Interscalene brachial plexus block   Pre-Anesthetic Checklist: ,, timeout performed, Correct Patient, Correct Site, Correct Laterality, Correct Procedure, Correct Position, site marked, Risks and benefits discussed,  Surgical consent,  Pre-op evaluation,  At surgeon's request and post-op pain management  Laterality: Left  Prep: chloraprep       Needles:  Injection technique: Single-shot  Needle Type: Stimiplex     Needle Length: 9cm  Needle Gauge: 21     Additional Needles:   Procedures:,,,, ultrasound used (permanent image in chart),,,,  Narrative:  Start time: 10/05/2017 8:59 AM End time: 10/05/2017 9:04 AM Injection made incrementally with aspirations every 5 mL.  Performed by: Personally  Anesthesiologist: Lynda Rainwater, MD

## 2017-10-05 NOTE — Anesthesia Postprocedure Evaluation (Signed)
Anesthesia Post Note  Patient: Peter Ramsey  Procedure(s) Performed: SHOULDER HEMI-ARTHROPLASTY (Left Shoulder)     Patient location during evaluation: PACU Anesthesia Type: General Level of consciousness: awake and alert Pain management: pain level controlled Vital Signs Assessment: post-procedure vital signs reviewed and stable Respiratory status: spontaneous breathing, nonlabored ventilation and respiratory function stable Cardiovascular status: blood pressure returned to baseline and stable Postop Assessment: no apparent nausea or vomiting Anesthetic complications: no    Last Vitals:  Vitals:   10/05/17 1230 10/05/17 1252  BP:  (!) 144/83  Pulse: 77 96  Resp: 18   Temp: 36.5 C 36.5 C  SpO2: 97% 96%    Last Pain:  Vitals:   10/05/17 1305  TempSrc:   PainSc: 2                  Lynda Rainwater

## 2017-10-06 ENCOUNTER — Encounter (HOSPITAL_COMMUNITY): Payer: Self-pay | Admitting: Orthopedic Surgery

## 2017-10-06 LAB — BASIC METABOLIC PANEL
ANION GAP: 14 (ref 5–15)
BUN: 7 mg/dL (ref 6–20)
CO2: 21 mmol/L — ABNORMAL LOW (ref 22–32)
Calcium: 9.8 mg/dL (ref 8.9–10.3)
Chloride: 102 mmol/L (ref 98–111)
Creatinine, Ser: 1.04 mg/dL (ref 0.61–1.24)
GFR calc Af Amer: >60 mL/min (ref >60)
GFR calc non Af Amer: >60 mL/min (ref >60)
Glucose, Bld: 122 mg/dL — ABNORMAL HIGH (ref 70–99)
POTASSIUM: 4 mmol/L (ref 3.5–5.1)
SODIUM: 137 mmol/L (ref 135–145)

## 2017-10-06 LAB — CBC
HCT: 34.6 % — ABNORMAL LOW (ref 39.0–52.0)
HEMOGLOBIN: 11.8 g/dL — AB (ref 13.0–17.0)
MCH: 33.8 pg (ref 26.0–34.0)
MCHC: 34.1 g/dL (ref 30.0–36.0)
MCV: 99.1 fL (ref 78.0–100.0)
PLATELETS: 301 10*3/uL (ref 150–400)
RBC: 3.49 MIL/uL — AB (ref 4.22–5.81)
RDW: 10.8 % — ABNORMAL LOW (ref 11.5–15.5)
WBC: 10.6 10*3/uL — AB (ref 4.0–10.5)

## 2017-10-06 MED ORDER — METHOCARBAMOL 500 MG PO TABS: 500.0000 mg | ORAL_TABLET | Freq: Three times a day (TID) | ORAL | 0 refills | Status: DC

## 2017-10-06 MED ORDER — OXYCODONE HCL 5 MG PO TABS: 5.0000 mg | ORAL_TABLET | ORAL | 0 refills | Status: DC | PRN

## 2017-10-06 NOTE — Evaluation (Signed)
Occupational Therapy Evaluation and Discharge Patient Details Name: Peter Ramsey MRN: [id number] DOB: May 21, 1965 Today's Date: 10/06/2017    History of Present Illness LEFT SHOULDER HEMI-ARTHROPLASTY    Clinical Impression   This 52 yo male admitted and underwent above presents to acute OT with all education completed, we will D/C from acute OT.    Follow Up Recommendations  No OT follow up    Equipment Recommendations  None recommended by OT       Precautions / Restrictions Precautions Precautions: Shoulder Shoulder Interventions: Shoulder sling/immobilizer;Off for dressing/bathing/exercises Precaution Booklet Issued: Yes (comment) Precaution Comments: A/AAROM of shoulder flexion to 90 degrees, ER to neutral. NO abduction or pendulums Restrictions Weight Bearing Restrictions: Yes LUE Weight Bearing: Non weight bearing      Mobility Bed Mobility               General bed mobility comments: Pt up walking about upon my arrival  Transfers Overall transfer level: Independent                        ADL either performed or assessed with clinical judgement         Vision Patient Visual Report: No change from baseline              Pertinent Vitals/Pain Pain Assessment: 0-10 Pain Score: 6  Pain Location: left shoulder Pain Descriptors / Indicators: Aching;Sore Pain Intervention(s): Limited activity within patient's tolerance;Monitored during session     Hand Dominance Right   Extremity/Trunk Assessment Upper Extremity Assessment Upper Extremity Assessment: LUE deficits/detail LUE Deficits / Details: shoulder sx this admission; elbow and distally WNL           Communication Communication Communication: No difficulties   Cognition Arousal/Alertness: Awake/alert Behavior During Therapy: WFL for tasks assessed/performed Overall Cognitive Status: Within Functional Limits for tasks assessed                                         Exercises Other Exercises Other Exercises: Pt able to demonstrate to me AAROM for shoudler flexion (can get to ~45 degrees and is aware to not do more than 90 degrees), shoulder external rotation to netural (pt able to achieve that now), AROM elbow-forearm-wrist-hand  Other Exercises: Educated to do these 3x/day 10 reps each, pt is aware of no Abduction for LUE   Shoulder Instructions Shoulder Instructions Donning/doffing shirt without moving shoulder: Minimal assistance Method for sponge bathing under operated UE: (able to demonstrate for arms) Donning/doffing sling/immobilizer: Minimal assistance Correct positioning of sling/immobilizer: (verbalized understanding) Pendulum exercises (written home exercise program): (NA) ROM for elbow, wrist and digits of operated UE: Independent Sling wearing schedule (on at all times/off for ADL's): Independent Proper positioning of operated UE when showering: Independent Dressing change: (NA) Positioning of UE while sleeping: (verbalized understanding post demo)    Home Living Family/patient expects to be discharged to:: Private residence Living Arrangements: Spouse/significant other Available Help at Discharge: Family;Available 24 hours/day                                    Prior Functioning/Environment Level of Independence: Independent        Comments: works full time as Education officer, environmental Problem List: Decreased strength;Decreased range of motion;Pain;Impaired UE  functional use         OT Goals(Current goals can be found in the care plan section) Acute Rehab OT Goals Patient Stated Goal: home today  OT Frequency:                AM-PAC PT "6 Clicks" Daily Activity     Outcome Measure Help from another person eating meals?: None Help from another person taking care of personal grooming?: None Help from another person toileting, which includes using toliet, bedpan, or urinal?: None Help from  another person bathing (including washing, rinsing, drying)?: A Little Help from another person to put on and taking off regular upper body clothing?: A Little Help from another person to put on and taking off regular lower body clothing?: A Little 6 Click Score: 21   End of Session Equipment Utilized During Treatment: (sling) Nurse Communication: (pt ready to go from OT standpoint)  Activity Tolerance: Patient tolerated treatment well Patient left: (walking about in room)  OT Visit Diagnosis: Pain Pain - Right/Left: Left Pain - part of body: Shoulder                Time: 5035-4656 OT Time Calculation (min): 18 min Charges:  OT General Charges $OT Visit: 1 Visit OT Evaluation $OT Eval Moderate Complexity: 1 Mod  [place] [address], OTR/L Acute [facility] [phone] Office [phone]

## 2017-10-06 NOTE — Progress Notes (Signed)
   PATIENT ID: Peter Ramsey   1 Day Post-Op Procedure(s) (LRB): SHOULDER HEMI-ARTHROPLASTY (Left)  Subjective: Doing well, no complaints. Ready to dc.  Objective:  Vitals:   10/06/17 0100 10/06/17 0543  BP: 129/78 122/83  Pulse: 77 96  Resp: 18 18  Temp: 98.4 F (36.9 C) 98.6 F (37 C)  SpO2: 100% 100%     L UE dressing c/d/i Wiggles fingers, block still working  Labs:  Recent Labs    10/06/17 0503  HGB 11.8*   Recent Labs    10/06/17 0503  WBC 10.6*  RBC 3.49*  HCT 34.6*  PLT 301   Recent Labs    10/06/17 0503  NA 137  K 4.0  CL 102  CO2 21*  BUN 7  CREATININE 1.04  GLUCOSE 122*  CALCIUM 9.8    Assessment and Plan: 1 day s/p L shoulder hemiarthplasty for AVN OT- PROM goal to 90 FF 0 ER D/c home today  Fu with [facility] in 2 weeks  VTE proph: asa, scds

## 2017-10-06 NOTE — Progress Notes (Signed)
Written and verbal discharge instructions given to pt and spouse Leonce Bale. Hard scripts given to both. No questions. Declined offer for transport. Walks well without problems.

## 2017-10-06 NOTE — Discharge Summary (Signed)
Patient ID: Peter Ramsey MRN: [id number] DOB/AGE: 1965-09-25 52 y.o.  Admit date: 10/05/2017 Discharge date: 10/06/2017  Admission Diagnoses:  Active Problems:   S/P shoulder hemiarthroplasty, left   Discharge Diagnoses:  Same  Past Medical History:  Diagnosis Date  . Hypercholesteremia     Surgeries: Procedure(s): SHOULDER HEMI-ARTHROPLASTY on 10/05/2017   Consultants:   Discharged Condition: Improved  Hospital Course: Peter Ramsey is an 52 y.o. male who was admitted 10/05/2017 for operative treatment of left shoulder AVN. Patient has severe unremitting pain that affects sleep, daily activities, and work/hobbies. After pre-op clearance the patient was taken to the operating room on 10/05/2017 and underwent  Procedure(s): SHOULDER HEMI-ARTHROPLASTY.    Patient was given perioperative antibiotics:  Anti-infectives (From admission, onward)   Start     Dose/Rate Route Frequency Ordered Stop   10/05/17 1600  ceFAZolin (ANCEF) IVPB 2g/100 mL premix     2 g 200 mL/hr over 30 Minutes Intravenous Every 6 hours 10/05/17 1305 10/06/17 0526   10/05/17 0715  ceFAZolin (ANCEF) IVPB 2g/100 mL premix     2 g 200 mL/hr over 30 Minutes Intravenous On call to O.R. 10/05/17 0700 10/05/17 0947       Patient was given sequential compression devices, early ambulation, and asa to prevent DVT.  Patient benefited maximally from hospital stay and there were no complications.    Recent vital signs:  Patient Vitals for the past 24 hrs:  BP Temp Temp src Pulse Resp SpO2 Height Weight  10/06/17 0543 122/83 98.6 F (37 C) Oral 96 18 100 % - -  10/06/17 0100 129/78 98.4 F (36.9 C) Oral 77 18 100 % - -  10/05/17 2012 130/82 98.2 F (36.8 C) Oral 80 - 98 % - -  10/05/17 1252 (!) 144/83 97.7 F (36.5 C) Oral 96 - 96 % - -  10/05/17 1230 - 97.7 F (36.5 C) - 77 18 97 % - -  10/05/17 1225 (!) 134/92 - - 74 16 98 % - -  10/05/17 1215 - - - 75 18 98 % - -  10/05/17 1210 138/85 - - 73 19 99 % - -   10/05/17 1200 - - - 79 15 100 % - -  10/05/17 1155 135/83 - - 83 (!) 21 98 % - -  10/05/17 1145 - - - 86 16 100 % - -  10/05/17 1140 137/82 (!) 97.5 F (36.4 C) - 90 17 100 % - -  10/05/17 0905 114/89 - - - - - - -  10/05/17 0900 - - - 88 13 100 % - -  10/05/17 0855 - - - - 16 - - -  10/05/17 0808 - - - - - - 6\' 5"  (1.956 m) 65.5 kg     Recent laboratory studies:  Recent Labs    10/06/17 0503  WBC 10.6*  HGB 11.8*  HCT 34.6*  PLT 301  NA 137  K 4.0  CL 102  CO2 21*  BUN 7  CREATININE 1.04  GLUCOSE 122*  CALCIUM 9.8     Discharge Medications:   Allergies as of 10/06/2017   No Known Allergies     Medication List    STOP taking these medications   meloxicam 15 MG tablet Commonly known as:  MOBIC     TAKE these medications   methocarbamol 500 MG tablet Commonly known as:  ROBAXIN Take 1 tablet (500 mg total) by mouth 3 (three) times daily. As needed to spasm  ONE-A-DAY MENS PO Take 1 tablet by mouth daily.   oxyCODONE 5 MG immediate release tablet Commonly known as:  Oxy IR/ROXICODONE Take 1-2 tablets (5-10 mg total) by mouth every 4 (four) hours as needed for moderate pain (pain score 4-6).   traMADol 50 MG tablet Commonly known as:  ULTRAM Take 50 mg by mouth every 6 (six) hours as needed for moderate pain.   Vitamin D3 2000 units Tabs Take 2,000 Units by mouth daily.       Diagnostic Studies: Dg Chest 2 View  Result Date: 09/28/2017 CLINICAL DATA:  52 year old male preoperative study for shoulder surgery. Smoker. EXAM: CHEST - 2 VIEW COMPARISON:  None available. FINDINGS: Lung volumes and mediastinal contours are within normal limits. Mild upper lobe predominant diffuse increased pulmonary interstitial markings. Both lungs otherwise appear clear. No pneumothorax or pleural effusion. Visualized tracheal air column is within normal limits. No osseous abnormality identified. Negative visible bowel gas pattern. IMPRESSION: Mildly increased pulmonary  interstitial markings which can be seen in the setting of smoking. No acute cardiopulmonary abnormality. Electronically Signed   By: Genevie Ann M.D.   On: 09/28/2017 08:27   Dg Shoulder Left Port  Result Date: 10/05/2017 CLINICAL DATA:  Status post total shoulder replacement EXAM: LEFT SHOULDER - 1 VIEW COMPARISON:  Left shoulder MRI August 22, 2017 FINDINGS: Frontal view obtained. There is a shoulder replacement with prosthetic component well-seated. There is no fracture or dislocation. 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 Soft tissue air is an expected postoperative finding. IMPRESSION: Total shoulder prosthesis well-seated. No acute fracture or dislocation. 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 Electronically Signed   By: Lowella Grip III M.D.   On: 10/05/2017 13:35    Disposition: Discharge disposition: 01-Home or Self Care       Discharge Instructions    Call MD / Call 911   Complete by:  As directed    If you experience chest pain or shortness of breath, CALL 911 and be transported to the hospital emergency room.  If you develope a fever above 101 F, pus (white drainage) or increased drainage or redness at the wound, or calf pain, call your surgeon's office.   Constipation Prevention   Complete by:  As directed    Drink plenty of fluids.  Prune juice may be helpful.  You may use a stool softener, such as Colace (over the counter) 100 mg twice a day.  Use MiraLax (over the counter) for constipation as needed.   Diet - low sodium heart healthy   Complete by:  As directed    Increase activity slowly as tolerated   Complete by:  As directed       Follow-up Information    Tania Ade, MD. Schedule an appointment as soon as possible for a visit in 2 weeks.   Specialty:  Orthopedic Surgery Contact information: [address] [place] [address] [phone]            Signed: Grier Mitts 10/06/2017, 8:06  AM

## 2018-11-05 ENCOUNTER — Other Ambulatory Visit: Payer: Self-pay

## 2018-11-05 DIAGNOSIS — Z20822 Contact with and (suspected) exposure to covid-19: Secondary | ICD-10-CM

## 2018-11-06 LAB — NOVEL CORONAVIRUS, NAA: SARS-CoV-2, NAA: NOT DETECTED

## 2018-11-26 IMAGING — DX DG SHOULDER 1V*L*
1 series · 1 of 1 positions shown · non-contrast
Comparison: Left shoulder MRI August 22, 2017

CLINICAL DATA: Status post total shoulder replacement

EXAM:
LEFT SHOULDER - 1 VIEW

[shoulder ap]
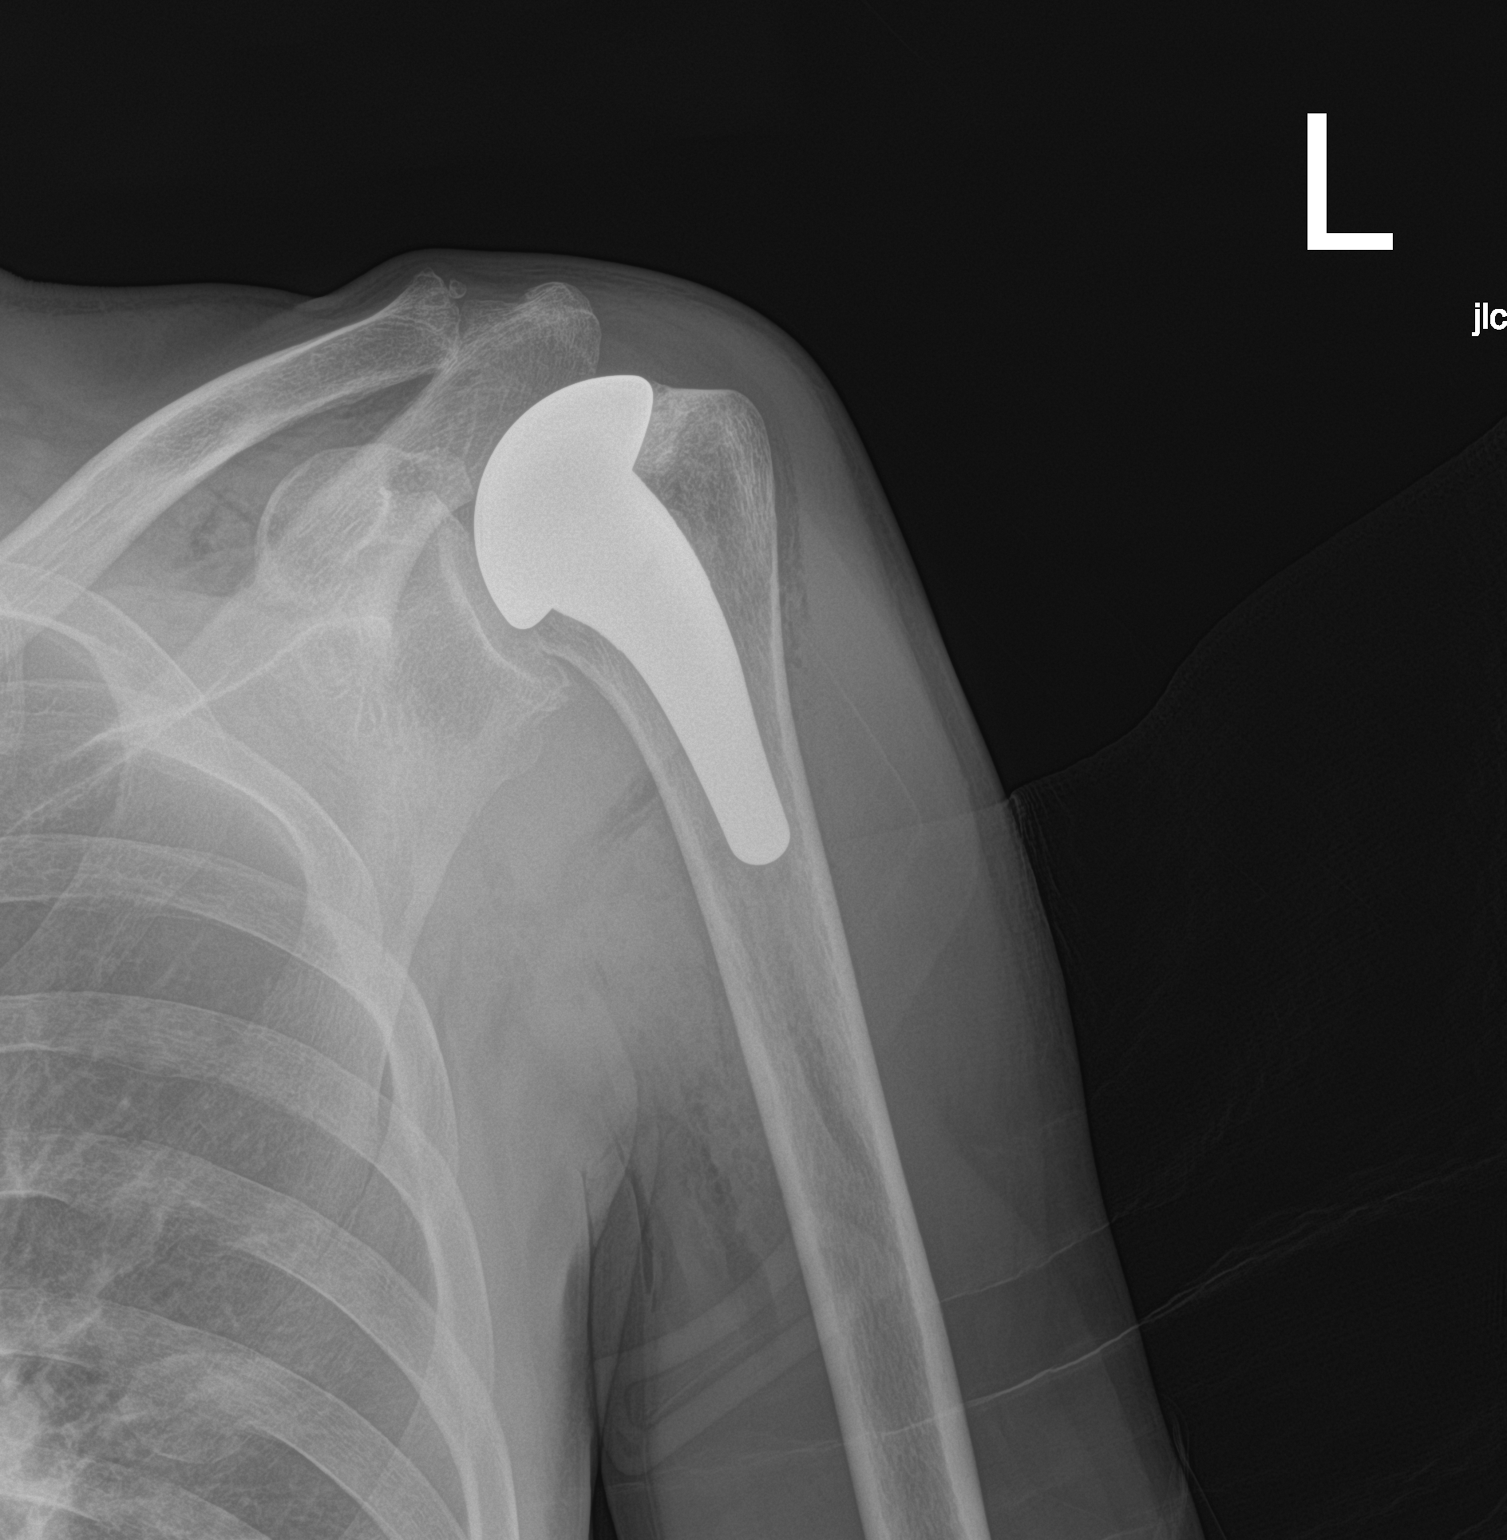

[1 of 1 positions shown; findings below may reference images not displayed]

FINDINGS: Frontal view obtained. There is a shoulder replacement with
prosthetic component well-seated. There is no fracture or
dislocation. There is osteoarthritic change in the acromioclavicular
joint. Soft tissue air is an expected postoperative finding.
IMPRESSION: Total shoulder prosthesis well-seated. No acute fracture or
dislocation. Osteoarthritic change in the acromioclavicular joint.

## 2018-12-15 ENCOUNTER — Ambulatory Visit (HOSPITAL_COMMUNITY)
Admission: EM | Admit: 2018-12-15 | Discharge: 2018-12-15 | Disposition: A | Discharge status: Home / Self Care | Payer: No Typology Code available for payment source | Attending: Internal Medicine | Admitting: Internal Medicine

## 2018-12-15 ENCOUNTER — Other Ambulatory Visit: Payer: Self-pay

## 2018-12-15 ENCOUNTER — Ambulatory Visit (INDEPENDENT_AMBULATORY_CARE_PROVIDER_SITE_OTHER): Payer: No Typology Code available for payment source

## 2018-12-15 ENCOUNTER — Encounter (HOSPITAL_COMMUNITY): Payer: Self-pay

## 2018-12-15 DIAGNOSIS — R079 Chest pain, unspecified: Secondary | ICD-10-CM

## 2018-12-15 DIAGNOSIS — J129 Viral pneumonia, unspecified: Secondary | ICD-10-CM | POA: Insufficient documentation

## 2018-12-15 DIAGNOSIS — Z79899 Other long term (current) drug therapy: Secondary | ICD-10-CM | POA: Insufficient documentation

## 2018-12-15 DIAGNOSIS — E7800 Pure hypercholesterolemia, unspecified: Secondary | ICD-10-CM | POA: Diagnosis not present

## 2018-12-15 DIAGNOSIS — R6883 Chills (without fever): Secondary | ICD-10-CM

## 2018-12-15 DIAGNOSIS — R0989 Other specified symptoms and signs involving the circulatory and respiratory systems: Secondary | ICD-10-CM | POA: Diagnosis not present

## 2018-12-15 DIAGNOSIS — J029 Acute pharyngitis, unspecified: Secondary | ICD-10-CM

## 2018-12-15 DIAGNOSIS — F17210 Nicotine dependence, cigarettes, uncomplicated: Secondary | ICD-10-CM | POA: Diagnosis not present

## 2018-12-15 DIAGNOSIS — R5381 Other malaise: Secondary | ICD-10-CM

## 2018-12-15 DIAGNOSIS — Z20828 Contact with and (suspected) exposure to other viral communicable diseases: Secondary | ICD-10-CM | POA: Diagnosis not present

## 2018-12-15 LAB — CBC WITH DIFFERENTIAL/PLATELET
Abs Immature Granulocytes: 0.02 10*3/uL (ref 0.00–0.07)
Basophils Absolute: 0 10*3/uL (ref 0.0–0.1)
Basophils Relative: 0 %
Eosinophils Absolute: 0 10*3/uL (ref 0.0–0.5)
Eosinophils Relative: 0 %
HCT: 39.5 % (ref 39.0–52.0)
Hemoglobin: 14.1 g/dL (ref 13.0–17.0)
Immature Granulocytes: 0 %
Lymphocytes Relative: 16 %
Lymphs Abs: 1 10*3/uL (ref 0.7–4.0)
MCH: 32.9 pg (ref 26.0–34.0)
MCHC: 35.7 g/dL (ref 30.0–36.0)
MCV: 92.3 fL (ref 80.0–100.0)
Monocytes Absolute: 0.4 10*3/uL (ref 0.1–1.0)
Monocytes Relative: 7 %
Neutro Abs: 4.7 10*3/uL (ref 1.7–7.7)
Neutrophils Relative %: 77 %
Platelets: 310 10*3/uL (ref 150–400)
RBC: 4.28 MIL/uL (ref 4.22–5.81)
RDW: 12.8 % (ref 11.5–15.5)
WBC: 6.1 10*3/uL (ref 4.0–10.5)
nRBC: 0 % (ref 0.0–0.2)

## 2018-12-15 MED ORDER — METHYLPREDNISOLONE SODIUM SUCC 125 MG IJ SOLR
INTRAMUSCULAR | Status: AC
  Filled 2018-12-15: qty 2

## 2018-12-15 MED ORDER — METHYLPREDNISOLONE SODIUM SUCC 125 MG IJ SOLR
125.0000 mg | Freq: Once | INTRAMUSCULAR | Status: AC
  Administered 2018-12-15: 125 mg via INTRAMUSCULAR

## 2018-12-15 MED ORDER — PREDNISONE 10 MG PO TABS: ORAL_TABLET | ORAL | 0 refills | Status: AC

## 2018-12-15 NOTE — ED Triage Notes (Signed)
Pt presents to UC w/ c/o chest pains since 8 days ago since he received a flu shot. Pt also c/o cold chills, sore throat since then.

## 2018-12-15 NOTE — ED Provider Notes (Signed)
[facility]    CSN: [id number] Arrival date & time: 12/15/18  1356      History   Chief Complaint Chief Complaint  Patient presents with  . chest pain, cold chills, sore throat    HPI Peter Ramsey is a 53 y.o. male.   53 year old male with no chronic medical problems listed urgent care complaining of malaise as well as sore throat and chest pain.  The patient reports that his chest began to hurt earlier this morning.  He denies nausea, vomiting or shortness of breath.  However, he admits to copious phlegm production.  He denies cough.  The patient correlates the onset of symptoms with receiving a flu shot 9 days ago.  He denies fevers but admits to chills.  His wife was positive for COVID-19 approximately 3 weeks ago.     Past Medical History:  Diagnosis Date  . Hypercholesteremia     Patient Active Problem List   Diagnosis Date Noted  . S/P shoulder hemiarthroplasty, left 10/05/2017    Past Surgical History:  Procedure Laterality Date  . APPENDECTOMY     as a child  . JOINT REPLACEMENT Left    2002 Left Hip  . SHOULDER HEMI-ARTHROPLASTY Left 10/05/2017  . SHOULDER HEMI-ARTHROPLASTY Left 10/05/2017   Procedure: SHOULDER HEMI-ARTHROPLASTY;  Surgeon: Tania Ade, MD;  Location: [facility];  Service: Orthopedics;  Laterality: Left;       Home Medications    Prior to Admission medications   Medication Sig Start Date End Date Taking? Authorizing Provider  Cholecalciferol (VITAMIN D3) 2000 units TABS Take 2,000 Units by mouth daily.    [provider]  methocarbamol (ROBAXIN) 500 MG tablet Take 1 tablet (500 mg total) by mouth 3 (three) times daily. As needed to spasm 10/06/17   Grier Mitts, PA-C  Multiple Vitamin (ONE-A-DAY MENS PO) Take 1 tablet by mouth daily.    [provider]  oxyCODONE (OXY IR/ROXICODONE) 5 MG immediate release tablet Take 1-2 tablets (5-10 mg total) by mouth every 4 (four) hours as needed for moderate pain  (pain score 4-6). 10/06/17   Grier Mitts, PA-C  predniSONE (DELTASONE) 10 MG tablet Take 5 tablets (50 mg total) by mouth daily for 2 days, THEN 4 tablets (40 mg total) daily for 2 days, THEN 3 tablets (30 mg total) daily for 2 days, THEN 2 tablets (20 mg total) daily for 2 days, THEN 1 tablet (10 mg total) daily for 2 days, THEN 0.5 tablets (5 mg total) daily for 2 days. 12/15/18 12/27/18  Harrie Foreman, MD  traMADol (ULTRAM) 50 MG tablet Take 50 mg by mouth every 6 (six) hours as needed for moderate pain. 09/13/17   [provider]    Family History Family History  Problem Relation Age of Onset  . Healthy Mother   . Healthy Father     Social History Social History   Tobacco Use  . Smoking status: Current Some Day Smoker    Packs/day: 0.25    Types: Cigarettes  . Smokeless tobacco: Never Used  Substance Use Topics  . Alcohol use: Yes    Alcohol/week: 1.0 - 2.0 standard drinks    Types: 1 - 2 Cans of beer per week  . Drug use: Yes    Types: Marijuana     Allergies   Patient has no known allergies.   Review of Systems Review of Systems  Constitutional: Positive for chills. Negative for fever.  HENT: Positive for sore throat. Negative  for tinnitus.   Eyes: Negative for redness.  Respiratory: Negative for cough and shortness of breath.   Cardiovascular: Positive for chest pain. Negative for palpitations.  Gastrointestinal: Negative for abdominal pain, diarrhea, nausea and vomiting.  Genitourinary: Negative for dysuria, frequency and urgency.  Musculoskeletal: Negative for myalgias.  Skin: Negative for rash.       No lesions  Neurological: Negative for weakness.  Hematological: Does not bruise/bleed easily.  Psychiatric/Behavioral: Negative for suicidal ideas.     Physical Exam Triage Vital Signs ED Triage Vitals  Enc Vitals Group     BP 12/15/18 1408 112/68     Pulse Rate 12/15/18 1408 (!) 102     Resp --      Temp 12/15/18 1408 98.6 F (37  C)     Temp Source 12/15/18 1408 Oral     SpO2 12/15/18 1408 100 %     Weight --      Height --      Head Circumference --      Peak Flow --      Pain Score 12/15/18 1409 7     Pain Loc --      Pain Edu? --      Excl. in [place]? --    No data found.  Updated Vital Signs BP 112/68 (BP Location: Right Arm)   Pulse (!) 102   Temp 98.6 F (37 C) (Oral)   SpO2 100%   Visual Acuity Right Eye Distance:   Left Eye Distance:   Bilateral Distance:    Right Eye Near:   Left Eye Near:    Bilateral Near:     Physical Exam Constitutional:      General: He is not in acute distress.    Appearance: He is well-developed.  HENT:     Head: Normocephalic and atraumatic.  Eyes:     General: No scleral icterus.    Conjunctiva/sclera: Conjunctivae normal.     Pupils: Pupils are equal, round, and reactive to light.  Neck:     Musculoskeletal: Normal range of motion and neck supple.     Thyroid: No thyromegaly.     Vascular: No JVD.     Trachea: No tracheal deviation.  Cardiovascular:     Rate and Rhythm: Normal rate and regular rhythm.     Heart sounds: Normal heart sounds. No murmur. No friction rub. No gallop.   Pulmonary:     Effort: Pulmonary effort is normal. No respiratory distress.     Breath sounds: Normal breath sounds.  Abdominal:     General: Bowel sounds are normal. There is no distension.     Palpations: Abdomen is soft.     Tenderness: There is no abdominal tenderness.  Musculoskeletal: Normal range of motion.  Lymphadenopathy:     Cervical: No cervical adenopathy.  Skin:    General: Skin is warm and dry.     Findings: No erythema or rash.  Neurological:     Mental Status: He is alert and oriented to person, place, and time.     Cranial Nerves: No cranial nerve deficit.  Psychiatric:        Behavior: Behavior normal.        Thought Content: Thought content normal.        Judgment: Judgment normal.      UC Treatments / Results  Labs (all labs ordered are  listed, but only abnormal results are displayed) Labs Reviewed  NOVEL CORONAVIRUS, NAA (HOSP ORDER, SEND-OUT TO REF LAB;  TAT 18-24 HRS)  CBC WITH DIFFERENTIAL/PLATELET    EKG   Radiology Dg Chest 2 View  Result Date: 12/15/2018 CLINICAL DATA:  Shortness of breath, wheezing, cough EXAM: CHEST - 2 VIEW COMPARISON:  09/27/2017 FINDINGS: The heart size and mediastinal contours are within normal limits. Both lungs are clear. The visualized skeletal structures are unremarkable. IMPRESSION: No acute abnormality of the lungs. Electronically Signed   By: Eddie Candle M.D.   On: 12/15/2018 15:33    Procedures Procedures (including critical care time)  Medications Ordered in UC Medications  methylPREDNISolone sodium succinate (SOLU-MEDROL) 125 mg/2 mL injection 125 mg (125 mg Intramuscular Given 12/15/18 1519)  methylPREDNISolone sodium succinate (SOLU-MEDROL) 125 mg/2 mL injection (has no administration in time range)    Initial Impression / Assessment and Plan / UC Course  I have reviewed the triage vital signs and the nursing notes.  Pertinent labs & imaging results that were available during my care of the patient were reviewed by me and considered in my medical decision making (see chart for details).     Concern for possible COVID-19 exposure.  The patient is afebrile and he denies cough or shortness of breath.  However, he admits to expectorating phlegm throughout the day and large amounts.  Oxygen saturations are normal in clinic.  He is intermittently tachypneic but lungs are clear to auscultation bilaterally.  Chest x-ray read as no acute process however my interpretation is that the patient has some streaky social markings as well as hyperexpansion that could represent COPD and/or viral pneumonia.  The latter is of greater concern right now.  I have administered Solu-Medrol in clinic and prescribed a long steroid taper.  The patient has been instructed to quarantine at home until he  is confirmed Covid negative.   Final Clinical Impressions(s) / UC Diagnoses   Final diagnoses:  Pneumonia, viral     Discharge Instructions     Quarantine yourself at home until you are confirmed COVID negative.  IF you are positive, CALL to make an appointment.  You may be directed to the hospital based on symptoms.  Do NOT walk into the Emergency Department or public spaces where you may infect other people.      ED Prescriptions    Medication Sig Dispense Auth. Provider   predniSONE (DELTASONE) 10 MG tablet Take 5 tablets (50 mg total) by mouth daily for 2 days, THEN 4 tablets (40 mg total) daily for 2 days, THEN 3 tablets (30 mg total) daily for 2 days, THEN 2 tablets (20 mg total) daily for 2 days, THEN 1 tablet (10 mg total) daily for 2 days, THEN 0.5 tablets (5 mg total) daily for 2 days. 31 tablet Harrie Foreman, MD     PDMP not reviewed this encounter.   Harrie Foreman, MD 12/15/18 [phone]

## 2018-12-15 NOTE — Discharge Instructions (Addendum)
Quarantine yourself at home until you are confirmed COVID negative.  IF you are positive, CALL to make an appointment.  You may be directed to the hospital based on symptoms.  Do NOT walk into the Emergency Department or public spaces where you may infect other people.

## 2018-12-16 LAB — NOVEL CORONAVIRUS, NAA (HOSP ORDER, SEND-OUT TO REF LAB; TAT 18-24 HRS): SARS-CoV-2, NAA: NOT DETECTED

## 2019-12-11 ENCOUNTER — Encounter: Payer: Self-pay | Admitting: Internal Medicine

## 2019-12-11 ENCOUNTER — Other Ambulatory Visit: Payer: Self-pay

## 2019-12-11 ENCOUNTER — Ambulatory Visit (INDEPENDENT_AMBULATORY_CARE_PROVIDER_SITE_OTHER): Payer: No Typology Code available for payment source | Admitting: Internal Medicine

## 2019-12-11 VITALS — BP 120/80 | HR 90 | Temp 98.5°F | Ht 77.0 in | Wt 150.1 lb

## 2019-12-11 DIAGNOSIS — E7800 Pure hypercholesterolemia, unspecified: Secondary | ICD-10-CM | POA: Diagnosis not present

## 2019-12-11 DIAGNOSIS — Z1211 Encounter for screening for malignant neoplasm of colon: Secondary | ICD-10-CM

## 2019-12-11 DIAGNOSIS — F17210 Nicotine dependence, cigarettes, uncomplicated: Secondary | ICD-10-CM | POA: Diagnosis not present

## 2019-12-11 DIAGNOSIS — F17200 Nicotine dependence, unspecified, uncomplicated: Secondary | ICD-10-CM | POA: Insufficient documentation

## 2019-12-11 DIAGNOSIS — Z23 Encounter for immunization: Secondary | ICD-10-CM
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
## 2019-12-11 NOTE — Addendum Note (Signed)
Addended by: Westley Hummer B on: 12/11/2019 05:20 PM   Modules accepted: Orders

## 2019-12-11 NOTE — Patient Instructions (Signed)
-  Nice seeing you today!!  -Flu and pneumonia vaccines today.  -Please get your COVID booster at the pharmacy.  -Schedule follow up in 3 months for your physical. Please come in fasting that day.

## 2019-12-11 NOTE — Addendum Note (Signed)
Addended by: Westley Hummer B on: 12/11/2019 01:16 PM   Modules accepted: Orders

## 2020-02-05 IMAGING — DX DG CHEST 2V
2 series · 2 of 2 positions shown · non-contrast
Comparison: 09/27/2017

CLINICAL DATA: Shortness of breath, wheezing, cough

EXAM:
CHEST - 2 VIEW

[chest pa]
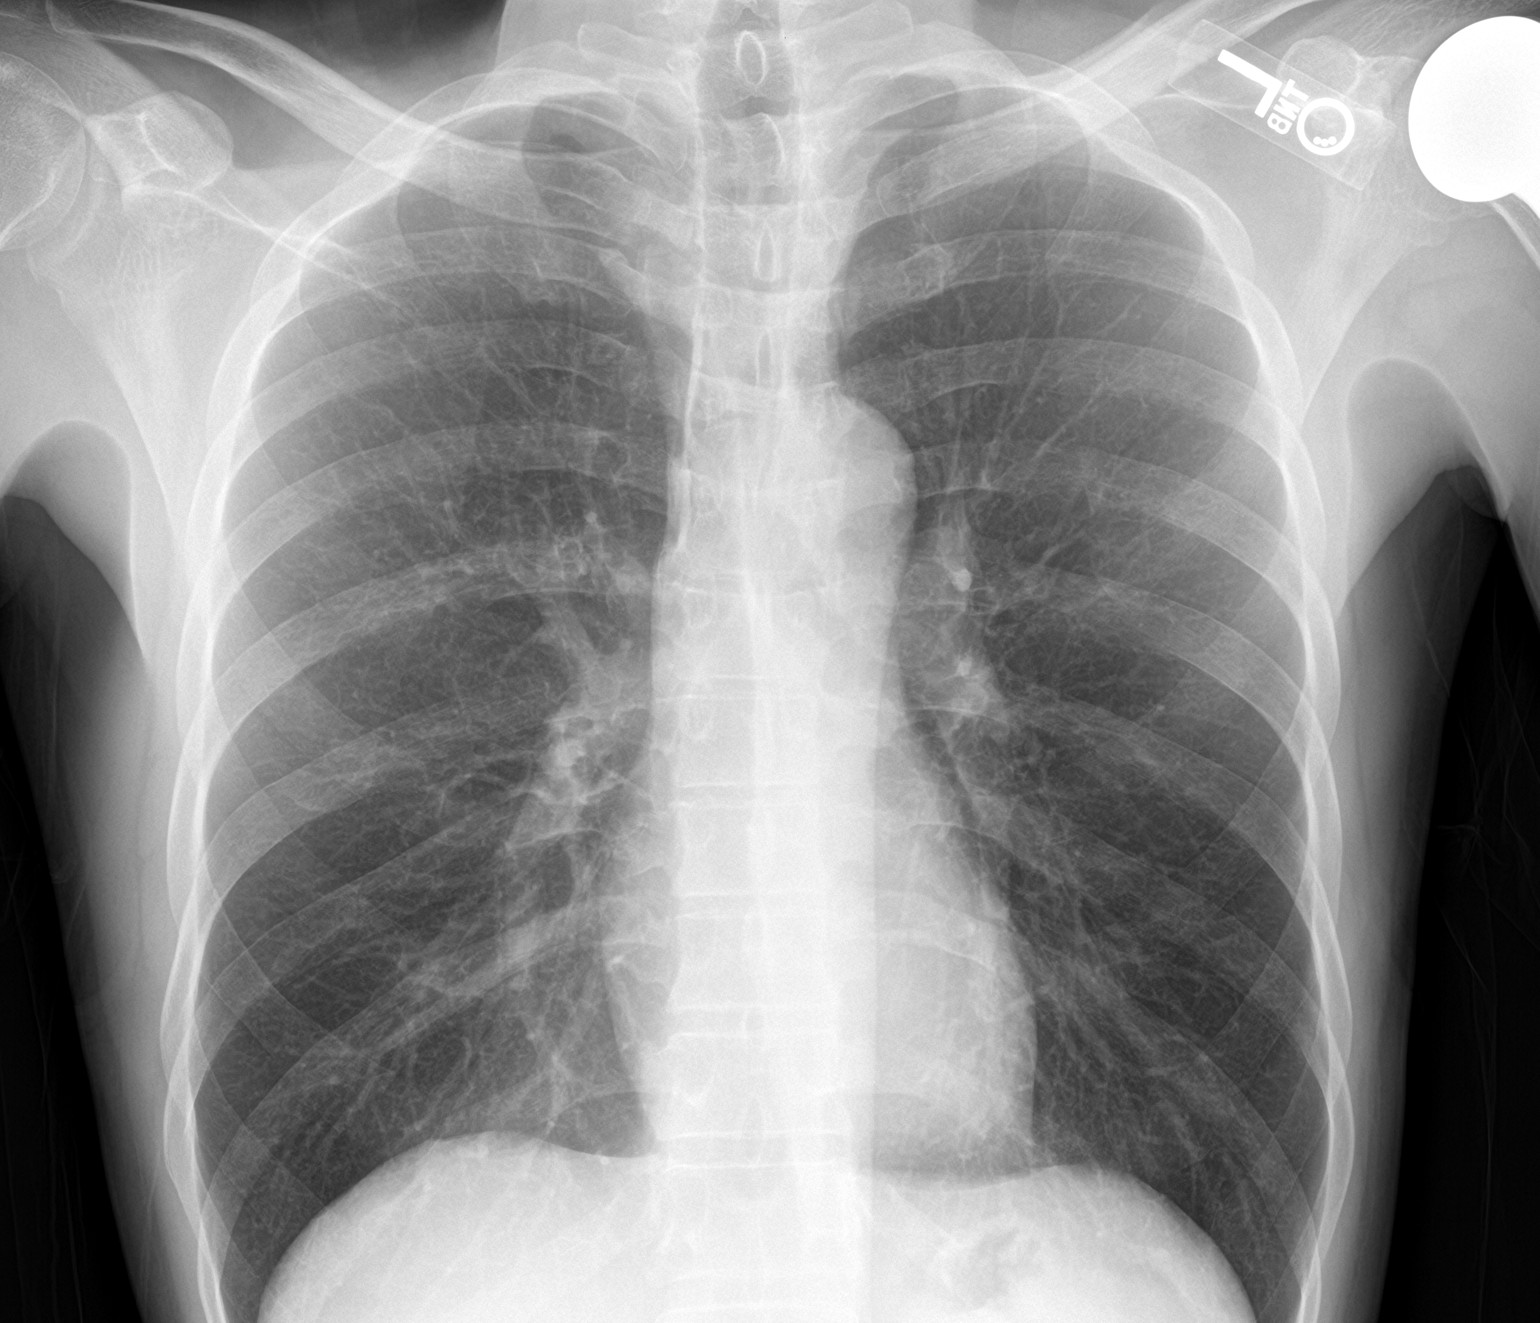

[chest lat]
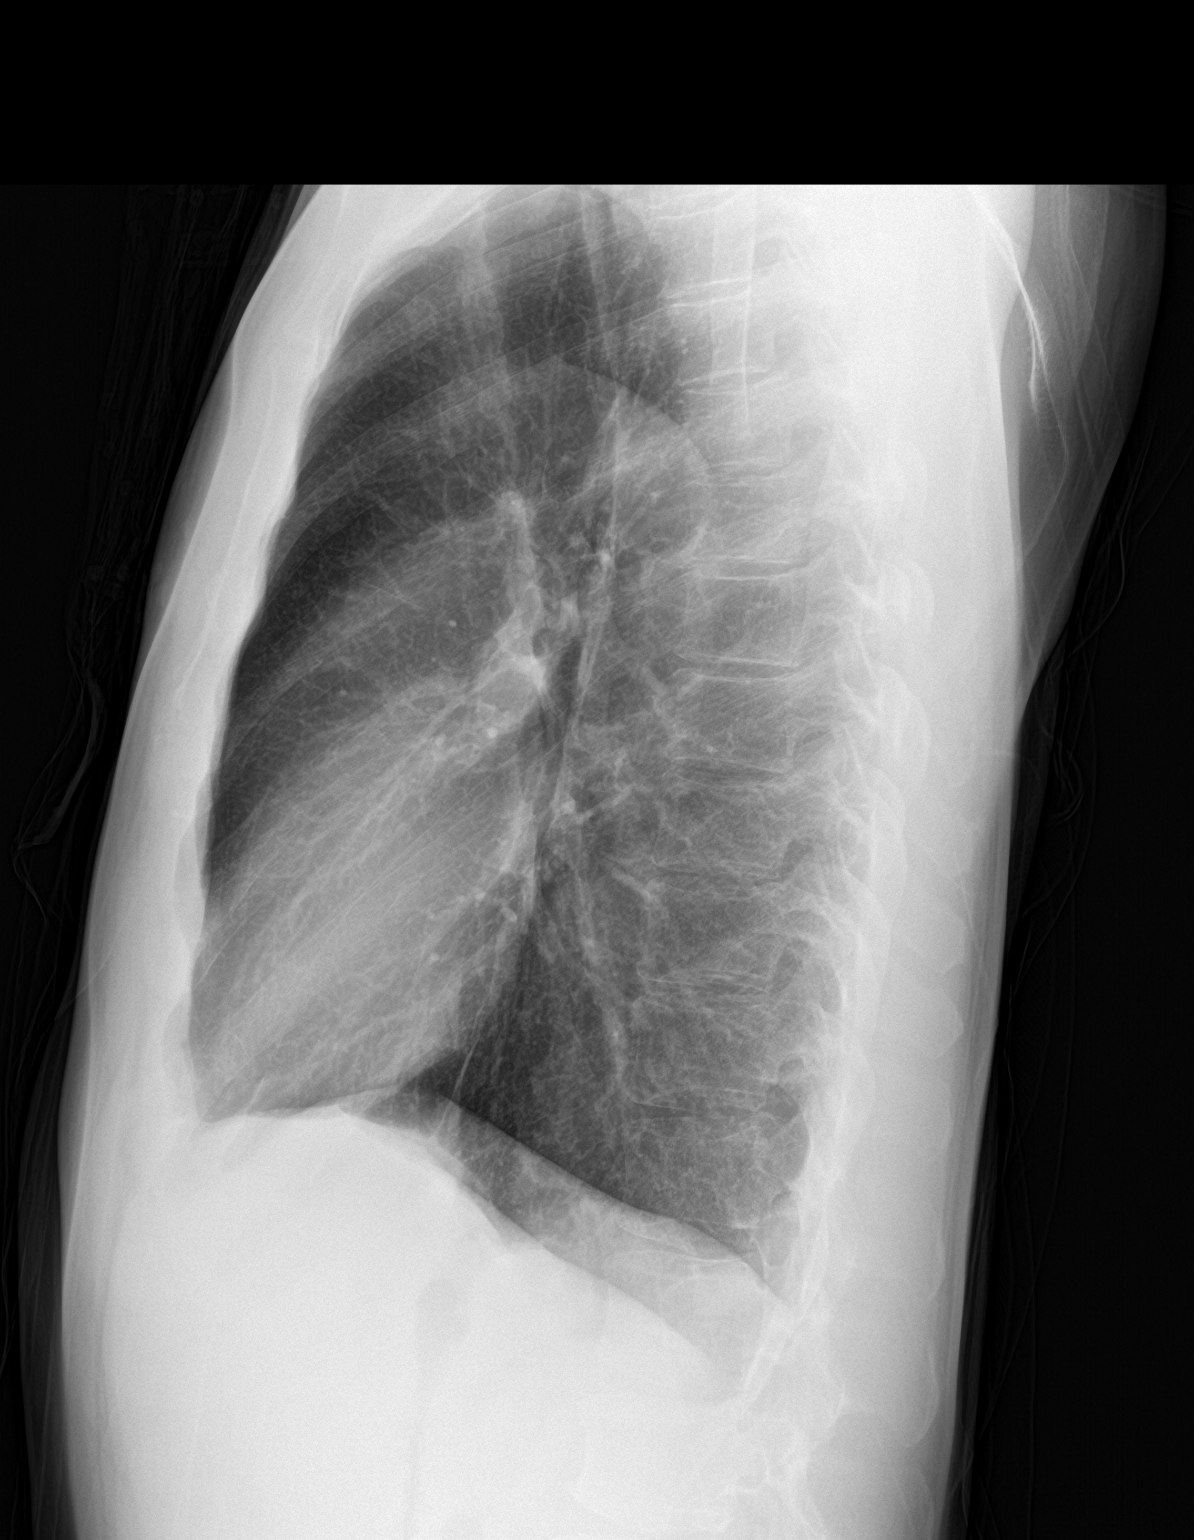

[2 of 2 positions shown; findings below may reference images not displayed]

FINDINGS: The heart size and mediastinal contours are within normal limits.
Both lungs are clear. The visualized skeletal structures are
unremarkable.
IMPRESSION: No acute abnormality of the lungs.

## 2020-03-12 ENCOUNTER — Ambulatory Visit (INDEPENDENT_AMBULATORY_CARE_PROVIDER_SITE_OTHER): Payer: No Typology Code available for payment source | Admitting: Internal Medicine

## 2020-03-12 ENCOUNTER — Encounter: Payer: Self-pay | Admitting: Internal Medicine

## 2020-03-12 ENCOUNTER — Other Ambulatory Visit: Payer: Self-pay

## 2020-03-12 VITALS — BP 110/70 | HR 77 | Temp 98.0°F | Ht 75.0 in | Wt 146.1 lb

## 2020-03-12 DIAGNOSIS — Z0000 Encounter for general adult medical examination without abnormal findings: Secondary | ICD-10-CM

## 2020-03-12 DIAGNOSIS — Z1211 Encounter for screening for malignant neoplasm of colon: Secondary | ICD-10-CM

## 2020-03-12 DIAGNOSIS — Z23 Encounter for immunization: Secondary | ICD-10-CM

## 2020-03-12 DIAGNOSIS — E7800 Pure hypercholesterolemia, unspecified: Secondary | ICD-10-CM

## 2020-03-12 LAB — CBC WITH DIFFERENTIAL/PLATELET
Basophils Absolute: 0 10*3/uL (ref 0.0–0.1)
Basophils Relative: 0.5 % (ref 0.0–3.0)
Eosinophils Absolute: 0.1 10*3/uL (ref 0.0–0.7)
Eosinophils Relative: 1.8 % (ref 0.0–5.0)
HCT: 37.3 % — ABNORMAL LOW (ref 39.0–52.0)
Hemoglobin: 12.7 g/dL — ABNORMAL LOW (ref 13.0–17.0)
Lymphocytes Relative: 31 % (ref 12.0–46.0)
Lymphs Abs: 1.3 10*3/uL (ref 0.7–4.0)
MCHC: 34.1 g/dL (ref 30.0–36.0)
MCV: 99.7 fl (ref 78.0–100.0)
Monocytes Absolute: 0.4 10*3/uL (ref 0.1–1.0)
Monocytes Relative: 10.2 % (ref 3.0–12.0)
Neutro Abs: 2.4 10*3/uL (ref 1.4–7.7)
Neutrophils Relative %: 56.5 % (ref 43.0–77.0)
Platelets: 315 10*3/uL (ref 150.0–400.0)
RBC: 3.74 Mil/uL — ABNORMAL LOW (ref 4.22–5.81)
RDW: 12.5 % (ref 11.5–15.5)
WBC: 4.2 10*3/uL (ref 4.0–10.5)

## 2020-03-12 LAB — TSH: TSH: 2.22 u[IU]/mL (ref 0.35–4.50)

## 2020-03-12 LAB — LIPID PANEL
Cholesterol: 207 mg/dL — ABNORMAL HIGH (ref 0–200)
HDL: 74.8 mg/dL (ref >39.00)
LDL Cholesterol: 94 mg/dL (ref 0–99)
NonHDL: 132.27
Total CHOL/HDL Ratio: 3
Triglycerides: 189 mg/dL — ABNORMAL HIGH (ref 0.0–149.0)
VLDL: 37.8 mg/dL (ref 0.0–40.0)

## 2020-03-12 LAB — COMPREHENSIVE METABOLIC PANEL
ALT: 21 U/L (ref 0–53)
AST: 38 U/L — ABNORMAL HIGH (ref 0–37)
Albumin: 4.7 g/dL (ref 3.5–5.2)
Alkaline Phosphatase: 81 U/L (ref 39–117)
BUN: 11 mg/dL (ref 6–23)
CO2: 28 mEq/L (ref 19–32)
Calcium: 10.2 mg/dL (ref 8.4–10.5)
Chloride: 102 mEq/L (ref 96–112)
Creatinine, Ser: 1.01 mg/dL (ref 0.40–1.50)
GFR: 85.32 mL/min (ref >60.00)
Glucose, Bld: 92 mg/dL (ref 70–99)
Potassium: 4.3 mEq/L (ref 3.5–5.1)
Sodium: 140 mEq/L (ref 135–145)
Total Bilirubin: 0.4 mg/dL (ref 0.2–1.2)
Total Protein: 7.3 g/dL (ref 6.0–8.3)

## 2020-03-12 LAB — VITAMIN B12: Vitamin B-12: 314 pg/mL (ref 211–911)

## 2020-03-12 LAB — PSA: PSA: 0.25 ng/mL (ref 0.10–4.00)

## 2020-03-12 LAB — VITAMIN D 25 HYDROXY (VIT D DEFICIENCY, FRACTURES): VITD: 59.88 ng/mL (ref 30.00–100.00)

## 2020-03-12 LAB — HEMOGLOBIN A1C: Hgb A1c MFr Bld: 5.6 % (ref 4.6–6.5)

## 2020-03-12 NOTE — Addendum Note (Signed)
Addended by: Tessie Fass D on: 03/12/2020 08:48 AM   Modules accepted: Orders

## 2020-03-12 NOTE — Patient Instructions (Signed)
-Nice seeing you today!!  -Lab work today; will notify you once results are available.  -First shingles vaccine today.  -Schedule follow up in 6 months.   Preventive Care 40-53 Years Old, Male Preventive care refers to lifestyle choices and visits with your health care provider that can promote health and wellness. This includes:  A yearly physical exam. This is also called an annual wellness visit.  Regular dental and eye exams.  Immunizations.  Screening for certain conditions.  Healthy lifestyle choices, such as: ? Eating a healthy diet. ? Getting regular exercise. ? Not using drugs or products that contain nicotine and tobacco. ? Limiting alcohol use. What can I expect for my preventive care visit? Physical exam Your health care provider will check your:  Height and weight. These may be used to calculate your BMI (body mass index). BMI is a measurement that tells if you are at a healthy weight.  Heart rate and blood pressure.  Body temperature.  Skin for abnormal spots. Counseling Your health care provider may ask you questions about your:  Past medical problems.  Family's medical history.  Alcohol, tobacco, and drug use.  Emotional well-being.  Home life and relationship well-being.  Sexual activity.  Diet, exercise, and sleep habits.  Work and work environment.  Access to firearms. What immunizations do I need? Vaccines are usually given at various ages, according to a schedule. Your health care provider will recommend vaccines for you based on your age, medical history, and lifestyle or other factors, such as travel or where you work.   What tests do I need? Blood tests  Lipid and cholesterol levels. These may be checked every 5 years, or more often if you are over 50 years old.  Hepatitis C test.  Hepatitis B test. Screening  Lung cancer screening. You may have this screening every year starting at age 55 if you have a 30-pack-year history  of smoking and currently smoke or have quit within the past 15 years.  Prostate cancer screening. Recommendations will vary depending on your family history and other risks.  Genital exam to check for testicular cancer or hernias.  Colorectal cancer screening. ? All adults should have this screening starting at age 50 and continuing until age 75. ? Your health care provider may recommend screening at age 45 if you are at increased risk. ? You will have tests every 1-10 years, depending on your results and the type of screening test.  Diabetes screening. ? This is done by checking your blood sugar (glucose) after you have not eaten for a while (fasting). ? You may have this done every 1-3 years.  STD (sexually transmitted disease) testing, if you are at risk. Follow these instructions at home: Eating and drinking  Eat a diet that includes fresh fruits and vegetables, whole grains, lean protein, and low-fat dairy products.  Take vitamin and mineral supplements as recommended by your health care provider.  Do not drink alcohol if your health care provider tells you not to drink.  If you drink alcohol: ? Limit how much you have to 0-2 drinks a day. ? Be aware of how much alcohol is in your drink. In the [place] one drink equals one 12 oz bottle of beer (355 mL), one 5 oz glass of wine (148 mL), or one 1 oz glass of hard liquor (44 mL).   Lifestyle  Take daily care of your teeth and gums. Brush your teeth every morning and night with fluoride toothpaste. Floss   one time each day.  Stay active. Exercise for at least 30 minutes 5 or more days each week.  Do not use any products that contain nicotine or tobacco, such as cigarettes, e-cigarettes, and chewing tobacco. If you need help quitting, ask your health care provider.  Do not use drugs.  If you are sexually active, practice safe sex. Use a condom or other form of protection to prevent STIs (sexually transmitted infections).  If  told by your health care provider, take low-dose aspirin daily starting at age 50.  Find healthy ways to cope with stress, such as: ? Meditation, yoga, or listening to music. ? Journaling. ? Talking to a trusted person. ? Spending time with friends and family. Safety  Always wear your seat belt while driving or riding in a vehicle.  Do not drive: ? If you have been drinking alcohol. Do not ride with someone who has been drinking. ? When you are tired or distracted. ? While texting.  Wear a helmet and other protective equipment during sports activities.  If you have firearms in your house, make sure you follow all gun safety procedures. What's next?  Go to your health care provider once a year for an annual wellness visit.  Ask your health care provider how often you should have your eyes and teeth checked.  Stay up to date on all vaccines. This information is not intended to replace advice given to you by your health care provider. Make sure you discuss any questions you have with your health care provider. Document Revised: 10/16/2018 Document Reviewed: 01/11/2018 Elsevier Patient Education  2021 Elsevier Inc.  

## 2020-03-12 NOTE — Addendum Note (Signed)
Addended by: Westley Hummer B on: 03/12/2020 08:55 AM   Modules accepted: Orders

## 2020-03-12 NOTE — Progress Notes (Signed)
Established Patient Office Visit     This visit occurred during the SARS-CoV-2 public health emergency.  Safety protocols were in place, including screening questions prior to the visit, additional usage of staff PPE, and extensive cleaning of exam room while observing appropriate contact time as indicated for disinfecting solutions.    CC/Reason for Visit: Annual preventive exam  HPI: Peter Ramsey is a 55 y.o. male who is coming in today for the above mentioned reasons. Past Medical History is significant for: Hyperlipidemia not on medication.  Her last known LDL of 65 in 2017 and nicotine dependence.  He has gone down from a pack a day to about a third of a pack a day since I last saw him.  He is not quite ready to fully quit.  All immunizations are up-to-date including COVID and influenza, he is due for shingles.  He is overdue for screening colonoscopy.  He has routine eye and dental care.  He exercises routinely.  He has no acute complaints today.   Past Medical/Surgical History: Past Medical History:  Diagnosis Date  . Hypercholesteremia   . Nicotine dependence     Past Surgical History:  Procedure Laterality Date  . APPENDECTOMY     as a child  . JOINT REPLACEMENT Left    2002 Left Hip  . SHOULDER HEMI-ARTHROPLASTY Left 10/05/2017  . SHOULDER HEMI-ARTHROPLASTY Left 10/05/2017   Procedure: SHOULDER HEMI-ARTHROPLASTY;  Surgeon: Tania Ade, MD;  Location: [facility];  Service: Orthopedics;  Laterality: Left;    Social History:  reports that he has been smoking cigarettes. He has been smoking about 1.00 pack per day. He has never used smokeless tobacco. He reports current alcohol use of about 12.0 standard drinks of alcohol per week. He reports current drug use. Drug: Marijuana.  Allergies: No Known Allergies  Family History:  Family History  Problem Relation Age of Onset  . Healthy Mother   . Healthy Father      Current Outpatient Medications:  .   Cholecalciferol (VITAMIN D3) 2000 units TABS, Take 2,000 Units by mouth daily., Disp: , Rfl:  .  Multiple Vitamin (ONE-A-DAY MENS PO), Take 1 tablet by mouth daily., Disp: , Rfl:   Review of Systems:  Constitutional: Denies fever, chills, diaphoresis, appetite change and fatigue.  HEENT: Denies photophobia, eye pain, redness, hearing loss, ear pain, congestion, sore throat, rhinorrhea, sneezing, mouth sores, trouble swallowing, neck pain, neck stiffness and tinnitus.   Respiratory: Denies SOB, DOE, cough, chest tightness,  and wheezing.   Cardiovascular: Denies chest pain, palpitations and leg swelling.  Gastrointestinal: Denies nausea, vomiting, abdominal pain, diarrhea, constipation, blood in stool and abdominal distention.  Genitourinary: Denies dysuria, urgency, frequency, hematuria, flank pain and difficulty urinating.  Endocrine: Denies: hot or cold intolerance, sweats, changes in hair or nails, polyuria, polydipsia. Musculoskeletal: Denies myalgias, back pain, joint swelling, arthralgias and gait problem.  Skin: Denies pallor, rash and wound.  Neurological: Denies dizziness, seizures, syncope, weakness, light-headedness, numbness and headaches.  Hematological: Denies adenopathy. Easy bruising, personal or family bleeding history  Psychiatric/Behavioral: Denies suicidal ideation, mood changes, confusion, nervousness, sleep disturbance and agitation    Physical Exam: Vitals:   03/12/20 0813  BP: 110/70  Pulse: 77  Temp: 98 F (36.7 C)  TempSrc: Oral  SpO2: 98%  Weight: 146 lb 1.6 oz (66.3 kg)  Height: 6\' 3"  (1.905 m)    Body mass index is 18.26 kg/m.   Constitutional: NAD, calm, comfortable Eyes: PERRL, lids and conjunctivae  normal ENMT: Mucous membranes are moist. Posterior pharynx clear of any exudate or lesions. Normal dentition. Tympanic membrane is pearly white, no erythema or bulging. Neck: normal, supple, no masses, no thyromegaly Respiratory: clear to auscultation  bilaterally, no wheezing, no crackles. Normal respiratory effort. No accessory muscle use.  Cardiovascular: Regular rate and rhythm, no murmurs / rubs / gallops. No extremity edema. 2+ pedal pulses. No carotid bruits.  Abdomen: no tenderness, no masses palpated. No hepatosplenomegaly. Bowel sounds positive.  Musculoskeletal: no clubbing / cyanosis. No joint deformity upper and lower extremities. Good ROM, no contractures. Normal muscle tone.  Skin: no rashes, lesions, ulcers. No induration Neurologic: CN 2-12 grossly intact. Sensation intact, DTR normal. Strength 5/5 in all 4.  Psychiatric: Normal judgment and insight. Alert and oriented x 3. Normal mood.    Impression and Plan:  Encounter for preventive health examination  -Advised routine eye and dental care. -For shingles vaccine today, otherwise immunizations are up-to-date including COVID and influenza. -Screening labs today. -Healthy lifestyle discussed in detail. -PSA ordered today. -Referral to GI for initial screening colonoscopy.  Screening for malignant neoplasm of colon  - Plan: Ambulatory referral to Gastroenterology  Need for shingles vaccine -Shingles vaccine administered today.  Hypercholesteremia  - Plan: Lipid panel   Patient Instructions   -Nice seeing you today!!  -Lab work today; will notify you once results are available.  -First shingles vaccine today.  -Schedule follow up in 6 months.   Preventive Care 32-94 Years Old, Male Preventive care refers to lifestyle choices and visits with your health care provider that can promote health and wellness. This includes:  A yearly physical exam. This is also called an annual wellness visit.  Regular dental and eye exams.  Immunizations.  Screening for certain conditions.  Healthy lifestyle choices, such as: ? Eating a healthy diet. ? Getting regular exercise. ? Not using drugs or products that contain nicotine and tobacco. ? Limiting alcohol  use. What can I expect for my preventive care visit? Physical exam Your health care provider will check your:  Height and weight. These may be used to calculate your BMI (body mass index). BMI is a measurement that tells if you are at a healthy weight.  Heart rate and blood pressure.  Body temperature.  Skin for abnormal spots. Counseling Your health care provider may ask you questions about your:  Past medical problems.  Family's medical history.  Alcohol, tobacco, and drug use.  Emotional well-being.  Home life and relationship well-being.  Sexual activity.  Diet, exercise, and sleep habits.  Work and work Statistician.  Access to firearms. What immunizations do I need? Vaccines are usually given at various ages, according to a schedule. Your health care provider will recommend vaccines for you based on your age, medical history, and lifestyle or other factors, such as travel or where you work.   What tests do I need? Blood tests  Lipid and cholesterol levels. These may be checked every 5 years, or more often if you are over 75 years old.  Hepatitis C test.  Hepatitis B test. Screening  Lung cancer screening. You may have this screening every year starting at age 41 if you have a 30-pack-year history of smoking and currently smoke or have quit within the past 15 years.  Prostate cancer screening. Recommendations will vary depending on your family history and other risks.  Genital exam to check for testicular cancer or hernias.  Colorectal cancer screening. ? All adults should have this screening  starting at age 81 and continuing until age 27. ? Your health care provider may recommend screening at age 65 if you are at increased risk. ? You will have tests every 1-10 years, depending on your results and the type of screening test.  Diabetes screening. ? This is done by checking your blood sugar (glucose) after you have not eaten for a while (fasting). ? You may  have this done every 1-3 years.  STD (sexually transmitted disease) testing, if you are at risk. Follow these instructions at home: Eating and drinking  Eat a diet that includes fresh fruits and vegetables, whole grains, lean protein, and low-fat dairy products.  Take vitamin and mineral supplements as recommended by your health care provider.  Do not drink alcohol if your health care provider tells you not to drink.  If you drink alcohol: ? Limit how much you have to 0-2 drinks a day. ? Be aware of how much alcohol is in your drink. In the [place] one drink equals one 12 oz bottle of beer (355 mL), one 5 oz glass of wine (148 mL), or one 1 oz glass of hard liquor (44 mL).   Lifestyle  Take daily care of your teeth and gums. Brush your teeth every morning and night with fluoride toothpaste. Floss one time each day.  Stay active. Exercise for at least 30 minutes 5 or more days each week.  Do not use any products that contain nicotine or tobacco, such as cigarettes, e-cigarettes, and chewing tobacco. If you need help quitting, ask your health care provider.  Do not use drugs.  If you are sexually active, practice safe sex. Use a condom or other form of protection to prevent STIs (sexually transmitted infections).  If told by your health care provider, take low-dose aspirin daily starting at age 45.  Find healthy ways to cope with stress, such as: ? Meditation, yoga, or listening to music. ? Journaling. ? Talking to a trusted person. ? Spending time with friends and family. Safety  Always wear your seat belt while driving or riding in a vehicle.  Do not drive: ? If you have been drinking alcohol. Do not ride with someone who has been drinking. ? When you are tired or distracted. ? While texting.  Wear a helmet and other protective equipment during sports activities.  If you have firearms in your house, make sure you follow all gun safety procedures. What's next?  Go to  your health care provider once a year for an annual wellness visit.  Ask your health care provider how often you should have your eyes and teeth checked.  Stay up to date on all vaccines. This information is not intended to replace advice given to you by your health care provider. Make sure you discuss any questions you have with your health care provider. Document Revised: 10/16/2018 Document Reviewed: 01/11/2018 Elsevier Patient Education  2021 [place], MD [facility]

## 2020-09-09 ENCOUNTER — Other Ambulatory Visit: Payer: Self-pay

## 2020-09-09 ENCOUNTER — Ambulatory Visit (INDEPENDENT_AMBULATORY_CARE_PROVIDER_SITE_OTHER): Payer: No Typology Code available for payment source | Admitting: Internal Medicine

## 2020-09-09 ENCOUNTER — Encounter: Payer: Self-pay | Admitting: Internal Medicine

## 2020-09-09 VITALS — BP 110/70 | HR 83 | Temp 97.8°F | Wt 145.4 lb

## 2020-09-09 DIAGNOSIS — Z23 Encounter for immunization: Secondary | ICD-10-CM

## 2020-09-09 DIAGNOSIS — E7800 Pure hypercholesterolemia, unspecified: Secondary | ICD-10-CM

## 2020-09-09 DIAGNOSIS — F17210 Nicotine dependence, cigarettes, uncomplicated: Secondary | ICD-10-CM

## 2020-09-09 NOTE — Progress Notes (Signed)
Established Patient Office Visit     This visit occurred during the SARS-CoV-2 public health emergency.  Safety protocols were in place, including screening questions prior to the visit, additional usage of staff PPE, and extensive cleaning of exam room while observing appropriate contact time as indicated for disinfecting solutions.    CC/Reason for Visit: Follow-up chronic conditions, shingles vaccine  HPI: Peter Ramsey is a 55 y.o. male who is coming in today for the above mentioned reasons. Past Medical History is significant for: Hyperlipidemia not on medication and ongoing tobacco abuse of about a third to half pack a day.  He is not interested in discussing smoking cessation today.  He is requesting a second shingles vaccine.  He is also due for his second COVID booster.  A referral was made at his physical in February for his colonoscopy which he has delayed.   Past Medical/Surgical History: Past Medical History:  Diagnosis Date   Hypercholesteremia    Nicotine dependence     Past Surgical History:  Procedure Laterality Date   APPENDECTOMY     as a child   JOINT REPLACEMENT Left    2002 Left Hip   SHOULDER HEMI-ARTHROPLASTY Left 10/05/2017   SHOULDER HEMI-ARTHROPLASTY Left 10/05/2017   Procedure: SHOULDER HEMI-ARTHROPLASTY;  Surgeon: Tania Ade, MD;  Location: [facility];  Service: Orthopedics;  Laterality: Left;    Social History:  reports that he has been smoking cigarettes. He has been smoking an average of 1 pack per day. He has never used smokeless tobacco. He reports current alcohol use of about 12.0 standard drinks of alcohol per week. He reports current drug use. Drug: Marijuana.  Allergies: No Known Allergies  Family History:  Family History  Problem Relation Age of Onset   Healthy Mother    Healthy Father      Current Outpatient Medications:    Cholecalciferol (VITAMIN D3) 2000 units TABS, Take 2,000 Units by mouth daily., Disp: , Rfl:     Multiple Vitamin (ONE-A-DAY MENS PO), Take 1 tablet by mouth daily., Disp: , Rfl:   Review of Systems:  Constitutional: Denies fever, chills, diaphoresis, appetite change and fatigue.  HEENT: Denies photophobia, eye pain, redness, hearing loss, ear pain, congestion, sore throat, rhinorrhea, sneezing, mouth sores, trouble swallowing, neck pain, neck stiffness and tinnitus.   Respiratory: Denies SOB, DOE, cough, chest tightness,  and wheezing.   Cardiovascular: Denies chest pain, palpitations and leg swelling.  Gastrointestinal: Denies nausea, vomiting, abdominal pain, diarrhea, constipation, blood in stool and abdominal distention.  Genitourinary: Denies dysuria, urgency, frequency, hematuria, flank pain and difficulty urinating.  Endocrine: Denies: hot or cold intolerance, sweats, changes in hair or nails, polyuria, polydipsia. Musculoskeletal: Denies myalgias, back pain, joint swelling, arthralgias and gait problem.  Skin: Denies pallor, rash and wound.  Neurological: Denies dizziness, seizures, syncope, weakness, light-headedness, numbness and headaches.  Hematological: Denies adenopathy. Easy bruising, personal or family bleeding history  Psychiatric/Behavioral: Denies suicidal ideation, mood changes, confusion, nervousness, sleep disturbance and agitation    Physical Exam: Vitals:   09/09/20 0655  BP: 110/70  Pulse: 83  Temp: 97.8 F (36.6 C)  TempSrc: Oral  SpO2: 99%  Weight: 145 lb 6.4 oz (66 kg)    Body mass index is 18.17 kg/m.   Constitutional: NAD, calm, comfortable Eyes: PERRL, lids and conjunctivae normal ENMT: Mucous membranes are moist.  Respiratory: clear to auscultation bilaterally, no wheezing, no crackles. Normal respiratory effort. No accessory muscle use.  Cardiovascular: Regular rate and rhythm,  no murmurs / rubs / gallops. No extremity edema.  Neurologic: Grossly intact and nonfocal Psychiatric: Normal judgment and insight. Alert and oriented x 3. Normal  mood.    Impression and Plan:  Hypercholesteremia -Last lipid panel in February 2022 with a total cholesterol of 207, triglycerides 189 and LDL 94.  He is not currently on medication.  Cigarette nicotine dependence without complication -He is smoking about 1/2 pack a day, he is not interested in discussing smoking cessation.  Need for shingles vaccine -Second and final shingles vaccine administered today.    Patient Instructions  -Nice seeing you today!!  -Last shingles vaccine today.  -Remember your 2nd COVID booster at the pharmacy.  -Schedule follow up in 6 months for your physical. Please come in fasting that day.    Lelon Frohlich, MD [facility] at [facility]

## 2020-09-09 NOTE — Patient Instructions (Signed)
-  Nice seeing you today!!  -Last shingles vaccine today.  -Remember your 2nd COVID booster at the pharmacy.  -Schedule follow up in 6 months for your physical. Please come in fasting that day.

## 2020-09-09 NOTE — Addendum Note (Signed)
Addended by: Westley Hummer B on: 09/09/2020 07:41 AM   Modules accepted: Orders

## 2021-03-15 ENCOUNTER — Ambulatory Visit (INDEPENDENT_AMBULATORY_CARE_PROVIDER_SITE_OTHER): Payer: 59 | Admitting: Internal Medicine

## 2021-03-15 ENCOUNTER — Encounter: Payer: Self-pay | Admitting: Internal Medicine

## 2021-03-15 VITALS — BP 110/70 | HR 94 | Temp 98.0°F | Ht 76.0 in | Wt 145.9 lb

## 2021-03-15 DIAGNOSIS — E7800 Pure hypercholesterolemia, unspecified: Secondary | ICD-10-CM

## 2021-03-15 DIAGNOSIS — Z0000 Encounter for general adult medical examination without abnormal findings: Secondary | ICD-10-CM | POA: Diagnosis not present

## 2021-03-15 DIAGNOSIS — Z1211 Encounter for screening for malignant neoplasm of colon: Secondary | ICD-10-CM | POA: Diagnosis not present

## 2021-03-15 DIAGNOSIS — Z125 Encounter for screening for malignant neoplasm of prostate: Secondary | ICD-10-CM | POA: Diagnosis not present

## 2021-03-15 DIAGNOSIS — F17210 Nicotine dependence, cigarettes, uncomplicated: Secondary | ICD-10-CM | POA: Diagnosis not present

## 2021-03-15 DIAGNOSIS — M8588 Other specified disorders of bone density and structure, other site: Secondary | ICD-10-CM

## 2021-03-15 LAB — CBC WITH DIFFERENTIAL/PLATELET
Basophils Absolute: 0 10*3/uL (ref 0.0–0.1)
Basophils Relative: 0.6 % (ref 0.0–3.0)
Eosinophils Absolute: 0.1 10*3/uL (ref 0.0–0.7)
Eosinophils Relative: 2 % (ref 0.0–5.0)
HCT: 33.6 % — ABNORMAL LOW (ref 39.0–52.0)
Hemoglobin: 11.7 g/dL — ABNORMAL LOW (ref 13.0–17.0)
Lymphocytes Relative: 35.1 % (ref 12.0–46.0)
Lymphs Abs: 1.4 10*3/uL (ref 0.7–4.0)
MCHC: 35 g/dL (ref 30.0–36.0)
MCV: 100.9 fl — ABNORMAL HIGH (ref 78.0–100.0)
Monocytes Absolute: 0.4 10*3/uL (ref 0.1–1.0)
Monocytes Relative: 9.5 % (ref 3.0–12.0)
Neutro Abs: 2.1 10*3/uL (ref 1.4–7.7)
Neutrophils Relative %: 52.8 % (ref 43.0–77.0)
Platelets: 302 10*3/uL (ref 150.0–400.0)
RBC: 3.33 Mil/uL — ABNORMAL LOW (ref 4.22–5.81)
RDW: 12 % (ref 11.5–15.5)
WBC: 3.9 10*3/uL — ABNORMAL LOW (ref 4.0–10.5)

## 2021-03-15 LAB — HEMOGLOBIN A1C: Hgb A1c MFr Bld: 5.6 % (ref 4.6–6.5)

## 2021-03-15 LAB — PSA: PSA: 0.19 ng/mL (ref 0.10–4.00)

## 2021-03-15 NOTE — Progress Notes (Signed)
Established Patient Office Visit     This visit occurred during the SARS-CoV-2 public health emergency.  Safety protocols were in place, including screening questions prior to the visit, additional usage of staff PPE, and extensive cleaning of exam room while observing appropriate contact time as indicated for disinfecting solutions.    CC/Reason for Visit: Annual preventive exam  HPI: Peter Ramsey is a 56 y.o. male who is coming in today for the above mentioned reasons. Past Medical History is significant for: Mild hyperlipidemia not on medication and ongoing nicotine dependence, he is currently smoking about a third to half pack a day.  He is overdue for eye care but has routine dental care.  He is overdue for an initial screening colonoscopy, all age-appropriate immunizations are up-to-date.  He has no acute concerns or complaints today.   Past Medical/Surgical History: Past Medical History:  Diagnosis Date   Hypercholesteremia    Nicotine dependence     Past Surgical History:  Procedure Laterality Date   APPENDECTOMY     as a child   JOINT REPLACEMENT Left    2002 Left Hip   SHOULDER HEMI-ARTHROPLASTY Left 10/05/2017   SHOULDER HEMI-ARTHROPLASTY Left 10/05/2017   Procedure: SHOULDER HEMI-ARTHROPLASTY;  Surgeon: Tania Ade, MD;  Location: [place];  Service: Orthopedics;  Laterality: Left;    Social History:  reports that he has been smoking cigarettes. He has been smoking an average of 1 pack per day. He has never used smokeless tobacco. He reports current alcohol use of about 12.0 standard drinks per week. He reports current drug use. Drug: Marijuana.  Allergies: No Known Allergies  Family History:  Family History  Problem Relation Age of Onset   Healthy Mother    Healthy Father      Current Outpatient Medications:    Cholecalciferol (VITAMIN D3) 2000 units TABS, Take 2,000 Units by mouth daily., Disp: , Rfl:    Multiple Vitamin (ONE-A-DAY MENS PO),  Take 1 tablet by mouth daily., Disp: , Rfl:   Review of Systems:  Constitutional: Denies fever, chills, diaphoresis, appetite change and fatigue.  HEENT: Denies photophobia, eye pain, redness, hearing loss, ear pain, congestion, sore throat, rhinorrhea, sneezing, mouth sores, trouble swallowing, neck pain, neck stiffness and tinnitus.   Respiratory: Denies SOB, DOE, cough, chest tightness,  and wheezing.   Cardiovascular: Denies chest pain, palpitations and leg swelling.  Gastrointestinal: Denies nausea, vomiting, abdominal pain, diarrhea, constipation, blood in stool and abdominal distention.  Genitourinary: Denies dysuria, urgency, frequency, hematuria, flank pain and difficulty urinating.  Endocrine: Denies: hot or cold intolerance, sweats, changes in hair or nails, polyuria, polydipsia. Musculoskeletal: Denies myalgias, back pain, joint swelling, arthralgias and gait problem.  Skin: Denies pallor, rash and wound.  Neurological: Denies dizziness, seizures, syncope, weakness, light-headedness, numbness and headaches.  Hematological: Denies adenopathy. Easy bruising, personal or family bleeding history  Psychiatric/Behavioral: Denies suicidal ideation, mood changes, confusion, nervousness, sleep disturbance and agitation    Physical Exam: Vitals:   03/15/21 0708  BP: 110/70  Pulse: 94  Temp: 98 F (36.7 C)  TempSrc: Oral  SpO2: 100%  Weight: 145 lb 14.4 oz (66.2 kg)  Height: 6\' 4"  (1.93 m)    Body mass index is 17.76 kg/m.   Constitutional: NAD, calm, comfortable Eyes: PERRL, lids and conjunctivae normal ENMT: Mucous membranes are moist. Posterior pharynx clear of any exudate.  He has a verrucous, about 1-1/2 inches in length,lesion of his hard palate normal dentition. Tympanic membrane is pearly white,  no erythema or bulging. Neck: normal, supple, no masses, no thyromegaly Respiratory: clear to auscultation bilaterally, no wheezing, no crackles. Normal respiratory effort. No  accessory muscle use.  Cardiovascular: Regular rate and rhythm, no murmurs / rubs / gallops. No extremity edema. 2+ pedal pulses. No carotid bruits.  Abdomen: no tenderness, no masses palpated. No hepatosplenomegaly. Bowel sounds positive.  Musculoskeletal: no clubbing / cyanosis. No joint deformity upper and lower extremities. Good ROM, no contractures. Normal muscle tone.  Skin: no rashes, lesions, ulcers. No induration Neurologic: CN 2-12 grossly intact. Sensation intact, DTR normal. Strength 5/5 in all 4.  Psychiatric: Normal judgment and insight. Alert and oriented x 3. Normal mood.    Impression and Plan:  Encounter for preventive health examination  -Recommend routine eye and dental care. -Immunizations: All age-appropriate immunizations are up-to-date -Healthy lifestyle discussed in detail. -Labs to be updated today. -Colon cancer screening: Overdue, sent to GI -Breast cancer screening: Not applicable -Cervical cancer screening: Not applicable -Lung cancer screening: Not applicable -Prostate cancer screening: PSA -DEXA: Not applicable  Screening for malignant neoplasm of colon  - Plan: Ambulatory referral to Gastroenterology  Hypercholesteremia  - Plan: Lipid panel  Cigarette nicotine dependence without complication -I have discussed tobacco cessation with the patient.  I have counseled the patient regarding the negative impacts of continued tobacco use including but not limited to lung cancer, COPD, and cardiovascular disease.  I have discussed alternatives to tobacco and modalities that may help facilitate tobacco cessation including but not limited to biofeedback, hypnosis, and medications.  Total time spent with tobacco counseling was 4 minutes. -He remains in the precontemplative stage.  Mass of hard palate  - Plan: Ambulatory referral to ENT -With his ongoing smoking dependency, I do wonder about potential of malignancy.    Patient Instructions  -Nice seeing you  today!!  -Lab work today; will notify you once results are available.  -Referrals for your colonoscopy and to ENT placed today.  -Schedule follow up in 6 months.      Lelon Frohlich, MD [facility] at [facility]

## 2021-03-15 NOTE — Patient Instructions (Signed)
-  Nice seeing you today!!  -Lab work today; will notify you once results are available.  -Referrals for your colonoscopy and to ENT placed today.  -Schedule follow up in 6 months.

## 2021-03-16 LAB — LIPID PANEL
Cholesterol: 232 mg/dL — ABNORMAL HIGH (ref 0–200)
HDL: 93.1 mg/dL (ref >39.00)
LDL Cholesterol: 102 mg/dL — ABNORMAL HIGH (ref 0–99)
NonHDL: 138.99
Total CHOL/HDL Ratio: 2
Triglycerides: 184 mg/dL — ABNORMAL HIGH (ref 0.0–149.0)
VLDL: 36.8 mg/dL (ref 0.0–40.0)

## 2021-03-16 LAB — COMPREHENSIVE METABOLIC PANEL
ALT: 23 U/L (ref 0–53)
AST: 30 U/L (ref 0–37)
Albumin: 4.4 g/dL (ref 3.5–5.2)
Alkaline Phosphatase: 78 U/L (ref 39–117)
BUN: 12 mg/dL (ref 6–23)
CO2: 29 mEq/L (ref 19–32)
Calcium: 9.8 mg/dL (ref 8.4–10.5)
Chloride: 103 mEq/L (ref 96–112)
Creatinine, Ser: 1.05 mg/dL (ref 0.40–1.50)
GFR: 80.86 mL/min (ref >60.00)
Glucose, Bld: 86 mg/dL (ref 70–99)
Potassium: 4.5 mEq/L (ref 3.5–5.1)
Sodium: 140 mEq/L (ref 135–145)
Total Bilirubin: 0.4 mg/dL (ref 0.2–1.2)
Total Protein: 7 g/dL (ref 6.0–8.3)

## 2021-03-17 ENCOUNTER — Telehealth: Payer: Self-pay

## 2021-03-17 ENCOUNTER — Encounter: Payer: Self-pay | Admitting: Internal Medicine

## 2021-03-17 ENCOUNTER — Other Ambulatory Visit: Payer: Self-pay | Admitting: Internal Medicine

## 2021-03-17 DIAGNOSIS — D539 Nutritional anemia, unspecified: Secondary | ICD-10-CM | POA: Insufficient documentation

## 2021-03-17 NOTE — Telephone Encounter (Signed)
LVM instructions for pt to schedule lab appt only for additional labwork.

## 2021-03-18 NOTE — Telephone Encounter (Signed)
Pt has lab appt sch fo r2-17-2023 9 am

## 2021-03-19 ENCOUNTER — Other Ambulatory Visit (INDEPENDENT_AMBULATORY_CARE_PROVIDER_SITE_OTHER): Payer: 59

## 2021-03-19 DIAGNOSIS — D539 Nutritional anemia, unspecified: Secondary | ICD-10-CM

## 2021-03-19 LAB — VITAMIN B12: Vitamin B-12: 334 pg/mL (ref 211–911)

## 2021-03-23 LAB — FOLATE RBC: RBC Folate: 895 ng/mL RBC (ref >280)

## 2021-08-06 ENCOUNTER — Encounter: Payer: Self-pay | Admitting: Gastroenterology

## 2021-08-31 ENCOUNTER — Ambulatory Visit (AMBULATORY_SURGERY_CENTER): Payer: Self-pay | Admitting: *Deleted

## 2021-08-31 VITALS — Ht 76.0 in | Wt 139.0 lb

## 2021-08-31 DIAGNOSIS — Z1211 Encounter for screening for malignant neoplasm of colon: Secondary | ICD-10-CM

## 2021-08-31 MED ORDER — NA SULFATE-K SULFATE-MG SULF 17.5-3.13-1.6 GM/177ML PO SOLN: 2.0000 | Freq: Once | ORAL | 0 refills | Status: AC

## 2021-08-31 NOTE — Progress Notes (Signed)

## 2021-09-14 ENCOUNTER — Encounter: Payer: Self-pay | Admitting: Gastroenterology

## 2021-09-21 ENCOUNTER — Encounter: Payer: Self-pay | Admitting: Gastroenterology

## 2021-09-21 ENCOUNTER — Ambulatory Visit (AMBULATORY_SURGERY_CENTER): Payer: 59 | Admitting: Gastroenterology

## 2021-09-21 VITALS — BP 122/77 | HR 74 | Temp 98.0°F | Resp 14 | Ht 76.0 in | Wt 139.0 lb

## 2021-09-21 DIAGNOSIS — D123 Benign neoplasm of transverse colon: Secondary | ICD-10-CM

## 2021-09-21 DIAGNOSIS — Z1211 Encounter for screening for malignant neoplasm of colon: Secondary | ICD-10-CM

## 2021-09-21 DIAGNOSIS — D127 Benign neoplasm of rectosigmoid junction: Secondary | ICD-10-CM

## 2021-09-21 DIAGNOSIS — K635 Polyp of colon: Secondary | ICD-10-CM | POA: Diagnosis not present

## 2021-09-21 MED ORDER — SODIUM CHLORIDE 0.9 % IV SOLN: 500.0000 mL | Freq: Once | INTRAVENOUS | Status: DC

## 2021-09-21 NOTE — Progress Notes (Signed)
To pacu, VSS. Report to [email or web address] 

## 2021-09-21 NOTE — Patient Instructions (Signed)
Discharge instructions given. Handouts on polyps,diverticulosis and hemorrhoids. Resume previous medications. YOU HAD AN ENDOSCOPIC PROCEDURE TODAY AT [facility]:   Refer to the procedure report that was given to you for any specific questions about what was found during the examination.  If the procedure report does not answer your questions, please call your gastroenterologist to clarify.  If you requested that your care partner not be given the details of your procedure findings, then the procedure report has been included in a sealed envelope for you to review at your convenience later.  YOU SHOULD EXPECT: Some feelings of bloating in the abdomen. Passage of more gas than usual.  Walking can help get rid of the air that was put into your GI tract during the procedure and reduce the bloating. If you had a lower endoscopy (such as a colonoscopy or flexible sigmoidoscopy) you may notice spotting of blood in your stool or on the toilet paper. If you underwent a bowel prep for your procedure, you may not have a normal bowel movement for a few days.  Please Note:  You might notice some irritation and congestion in your nose or some drainage.  This is from the oxygen used during your procedure.  There is no need for concern and it should clear up in a day or so.  SYMPTOMS TO REPORT IMMEDIATELY:  Following lower endoscopy (colonoscopy or flexible sigmoidoscopy):  Excessive amounts of blood in the stool  Significant tenderness or worsening of abdominal pains  Swelling of the abdomen that is new, acute  Fever of 100F or higher  For urgent or emergent issues, a gastroenterologist can be reached at any hour by calling [phone]. Do not use MyChart messaging for urgent concerns.    DIET:  We do recommend a small meal at first, but then you may proceed to your regular diet.  Drink plenty of fluids but you should avoid alcoholic beverages for 24 hours.  ACTIVITY:  You should plan  to take it easy for the rest of today and you should NOT DRIVE or use heavy machinery until tomorrow (because of the sedation medicines used during the test).    FOLLOW UP: Our staff will call the number listed on your records the next business day following your procedure.  We will call around 7:15- 8:00 am to check on you and address any questions or concerns that you may have regarding the information given to you following your procedure. If we do not reach you, we will leave a message.  If you develop any symptoms (ie: fever, flu-like symptoms, shortness of breath, cough etc.) before then, please call [phone].  If you test positive for Covid 19 in the 2 weeks post procedure, please call and report this information to [place].    If any biopsies were taken you will be contacted by phone or by letter within the next 1-3 weeks.  Please call us at [phone] if you have not heard about the biopsies in 3 weeks.    SIGNATURES/CONFIDENTIALITY: You and/or your care partner have signed paperwork which will be entered into your electronic medical record.  These signatures attest to the fact that that the information above on your After Visit Summary has been reviewed and is understood.  Full responsibility of the confidentiality of this discharge information lies with you and/or your care-partner.

## 2021-09-21 NOTE — Op Note (Signed)
[facility] Patient Name: Peter Ramsey Procedure Date: 09/21/2021 7:13 AM MRN: [id number] Endoscopist: Justice Britain , MD Age: 56 Referring MD:  Date of Birth: [birth year] Gender: Male Account #: [id number] Procedure:                Colonoscopy Indications:              Screening for colorectal malignant neoplasm Medicines:                Monitored Anesthesia Care Procedure:                After obtaining informed consent, the colonoscope                            was passed under direct vision. Throughout the                            procedure, the patient's blood pressure, pulse, and                            oxygen saturations were monitored continuously. The                            CF [id number] #[id number] was introduced through the anus                            and advanced to the the cecum, identified by                            appendiceal orifice and ileocecal valve. The                            colonoscopy was performed without difficulty. Scope In: 8:02:23 AM Scope Out: 8:15:24 AM Scope Withdrawal Time: 0 hours 9 minutes 21 seconds  Total Procedure Duration: 0 hours 13 minutes 1 second  Findings:                 The digital rectal exam findings include                            hemorrhoids. Pertinent negatives include no                            palpable rectal lesions.                           Two sessile polyps were found in the recto-sigmoid                            colon and transverse colon. The polyps were 3 to 4                            mm in size. These polyps were removed with a cold                            snare. Resection and retrieval were complete.  Multiple small-mouthed diverticula were found in                            the recto-sigmoid colon and sigmoid colon.                           Normal mucosa was found in the entire colon                            otherwise.                            Non-bleeding non-thrombosed internal hemorrhoids                            were found during retroflexion, during perianal                            exam and during digital exam. The hemorrhoids were                            Grade II (internal hemorrhoids that prolapse but                            reduce spontaneously). Complications:            No immediate complications. Estimated Blood Loss:     Estimated blood loss was minimal. Impression:               - Hemorrhoids found on digital rectal exam.                           - Two 3 to 4 mm polyps at the recto-sigmoid colon                            and in the transverse colon, removed with a cold                            snare. Resected and retrieved.                           - Diverticulosis in the recto-sigmoid colon and in                            the sigmoid colon.                           - Normal mucosa in the entire examined colon                            otherwise.                           - Non-bleeding non-thrombosed internal hemorrhoids. Recommendation:           - The patient will be observed post-procedure,  until all discharge criteria are met.                           - Discharge patient to home.                           - Patient has a contact number available for                            emergencies. The signs and symptoms of potential                            delayed complications were discussed with the                            patient. Return to normal activities tomorrow.                            Written discharge instructions were provided to the                            patient.                           - High fiber diet.                           - Continue present medications.                           - Await pathology results.                           - Repeat colonoscopy in 5-10 years for surveillance                            based on pathology  results.                           - The findings and recommendations were discussed                            with the patient.                           - The findings and recommendations were discussed                            with the patient's family. Justice Britain, MD 09/21/2021 8:19:11 AM

## 2021-09-21 NOTE — Progress Notes (Signed)
Called to room to assist during endoscopic procedure.  Patient ID and intended procedure confirmed with present staff. Received instructions for my participation in the procedure from the performing physician.  

## 2021-09-21 NOTE — Progress Notes (Signed)
GASTROENTEROLOGY PROCEDURE H&P NOTE   Primary Care Physician: Isaac Bliss, Rayford Halsted, MD  HPI: Peter Ramsey is a 56 y.o. male who presents for Colonoscopy for screening.  Past Medical History:  Diagnosis Date   Hypercholesteremia    Nicotine dependence    Past Surgical History:  Procedure Laterality Date   APPENDECTOMY     as a child   JOINT REPLACEMENT Left    2002 Left Hip   SHOULDER HEMI-ARTHROPLASTY Left 10/05/2017   SHOULDER HEMI-ARTHROPLASTY Left 10/05/2017   Procedure: SHOULDER HEMI-ARTHROPLASTY;  Surgeon: Tania Ade, MD;  Location: [place];  Service: Orthopedics;  Laterality: Left;   Current Outpatient Medications  Medication Sig Dispense Refill   Cholecalciferol (VITAMIN D3) 2000 units TABS Take 2,000 Units by mouth daily.     Multiple Vitamin (ONE-A-DAY MENS PO) Take 1 tablet by mouth daily.     Current Facility-Administered Medications  Medication Dose Route Frequency Provider Last Rate Last Admin   0.9 %  sodium chloride infusion  500 mL Intravenous Once Mansouraty, Telford Nab., MD        Current Outpatient Medications:    Cholecalciferol (VITAMIN D3) 2000 units TABS, Take 2,000 Units by mouth daily., Disp: , Rfl:    Multiple Vitamin (ONE-A-DAY MENS PO), Take 1 tablet by mouth daily., Disp: , Rfl:   Current Facility-Administered Medications:    0.9 %  sodium chloride infusion, 500 mL, Intravenous, Once, Mansouraty, Telford Nab., MD No Known Allergies Family History  Problem Relation Age of Onset   Breast cancer Mother    Healthy Mother    Healthy Father    Colon polyps Neg Hx    Colon cancer Neg Hx    Esophageal cancer Neg Hx    Stomach cancer Neg Hx    Rectal cancer Neg Hx    Social History   Socioeconomic History   Marital status: Married    Spouse name: Not on file   Number of children: Not on file   Years of education: Not on file   Highest education level: Not on file  Occupational History   Not on file  Tobacco Use   Smoking  status: Every Day    Packs/day: 1.00    Types: Cigarettes   Smokeless tobacco: Never  Vaping Use   Vaping Use: Never used  Substance and Sexual Activity   Alcohol use: Yes    Alcohol/week: 12.0 standard drinks of alcohol    Types: 12 Cans of beer per week    Comment: beer   Drug use: Yes    Frequency: 7.0 times per week    Types: Marijuana    Comment: did yesterday   Sexual activity: Not on file  Other Topics Concern   Not on file  Social History Narrative   Not on file   Social Determinants of Health   Financial Resource Strain: Not on file  Food Insecurity: Not on file  Transportation Needs: Not on file  Physical Activity: Not on file  Stress: Not on file  Social Connections: Not on file  Intimate Partner Violence: Not on file    Physical Exam: Today's Vitals   09/21/21 0714 09/21/21 0715  BP: 135/77   Pulse: 81   Temp: 98 F (36.7 C) 98 F (36.7 C)  TempSrc: Temporal   SpO2: 100%   Weight: 139 lb (63 kg)   Height: '6\' 4"'$  (1.93 m)    Body mass index is 16.92 kg/m. GEN: NAD EYE: Sclerae anicteric ENT: MMM CV:  Non-tachycardic GI: Soft, NT/ND NEURO:  Alert & Oriented x 3  Lab Results: No results for input(s): "WBC", "HGB", "HCT", "PLT" in the last 72 hours. BMET No results for input(s): "NA", "K", "CL", "CO2", "GLUCOSE", "BUN", "CREATININE", "CALCIUM" in the last 72 hours. LFT No results for input(s): "PROT", "ALBUMIN", "AST", "ALT", "ALKPHOS", "BILITOT", "BILIDIR", "IBILI" in the last 72 hours. PT/INR No results for input(s): "LABPROT", "INR" in the last 72 hours.   Impression / Plan: This is a Peter Ramsey  who presents for Colonoscopy for screening.  The risks and benefits of endoscopic evaluation/treatment were discussed with the patient and/or family; these include but are not limited to the risk of perforation, infection, bleeding, missed lesions, lack of diagnosis, severe illness requiring hospitalization, as well as anesthesia and sedation  related illnesses.  The patient's history has been reviewed, patient examined, no change in status, and deemed stable for procedure.  The patient and/or family is agreeable to proceed.    Justice Britain, MD [facility] Office # [phone]

## 2021-09-21 NOTE — Progress Notes (Signed)
Pt's states no medical or surgical changes since previsit or office visit. 

## 2021-09-22 ENCOUNTER — Telehealth: Payer: Self-pay | Admitting: *Deleted

## 2021-09-22 NOTE — Telephone Encounter (Signed)
Person that answered phoned stated I have the wrong number. Phone number called verified as [phone]

## 2021-09-28 ENCOUNTER — Encounter: Payer: Self-pay | Admitting: Gastroenterology

## 2022-03-17 ENCOUNTER — Ambulatory Visit: Payer: No Typology Code available for payment source | Admitting: Internal Medicine

## 2022-03-17 VITALS — BP 110/70 | HR 77 | Temp 98.8°F | Ht 75.5 in | Wt 142.8 lb

## 2022-03-17 DIAGNOSIS — M8588 Other specified disorders of bone density and structure, other site: Secondary | ICD-10-CM | POA: Diagnosis not present

## 2022-03-17 DIAGNOSIS — E7800 Pure hypercholesterolemia, unspecified: Secondary | ICD-10-CM | POA: Diagnosis not present

## 2022-03-17 DIAGNOSIS — Z0000 Encounter for general adult medical examination without abnormal findings: Secondary | ICD-10-CM | POA: Diagnosis not present

## 2022-03-17 DIAGNOSIS — F17210 Nicotine dependence, cigarettes, uncomplicated: Secondary | ICD-10-CM

## 2022-03-17 NOTE — Progress Notes (Addendum)
Established Patient Office Visit     CC/Reason for Visit: Annual preventive exam, follow-up chronic conditions  HPI: Peter Ramsey is a 57 y.o. male who is coming in today for the above mentioned reasons. Past Medical History is significant for: Hyperlipidemia and ongoing nicotine dependence of about three quarters to a pack a day.  He had his colonoscopy last year.  He follows yearly with urology for his prostate exam given his remote prostate cancer history.  He has not yet been screened for lung cancer.  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   Past Medical/Surgical History: Past Medical History:  Diagnosis Date   Hypercholesteremia    Nicotine dependence     Past Surgical History:  Procedure Laterality Date   APPENDECTOMY     as a child   JOINT REPLACEMENT Left    2002 Left Hip   SHOULDER HEMI-ARTHROPLASTY Left 10/05/2017   SHOULDER HEMI-ARTHROPLASTY Left 10/05/2017   Procedure: SHOULDER HEMI-ARTHROPLASTY;  Surgeon: Tania Ade, MD;  Location: [facility];  Service: Orthopedics;  Laterality: Left;    Social History:  reports that he has been smoking cigarettes. He has been smoking an average of 1 pack per day. He has never used smokeless tobacco. He reports current alcohol use of about 12.0 standard drinks of alcohol per week. He reports current drug use. Frequency: 7.00 times per week. Drug: Marijuana.  Allergies: No Known Allergies  Family History:  Family History  Problem Relation Age of Onset   Breast cancer Mother    Healthy Mother    Healthy Father    Colon polyps Neg Hx    Colon cancer Neg Hx    Esophageal cancer Neg Hx    Stomach cancer Neg Hx    Rectal cancer Neg Hx      Current Outpatient Medications:    Cholecalciferol (VITAMIN D3) 2000 units TABS, Take 2,000 Units by mouth daily., Disp: , Rfl:    Multiple Vitamin (ONE-A-DAY MENS PO), Take 1 tablet by mouth daily., Disp: , Rfl:   Review of  Systems:  Negative unless indicated in HPI.   Physical Exam: Vitals:   03/17/22 0708  BP: 110/70  Pulse: 77  Temp: 98.8 F (37.1 C)  TempSrc: Oral  SpO2: 100%  Weight: 142 lb 12.8 oz (64.8 kg)  Height: 6' 3.5" (1.918 m)    Body mass index is 17.61 kg/m.   Physical Exam Vitals reviewed.  Constitutional:      General: He is not in acute distress.    Appearance: Normal appearance. He is not ill-appearing, toxic-appearing or diaphoretic.  HENT:     Head: Normocephalic and atraumatic.     Comments: Hard palate mass    Right Ear: Tympanic membrane, ear canal and external ear normal. There is no impacted cerumen.     Left Ear: Tympanic membrane, ear canal and external ear normal. There is no impacted cerumen.     Nose: Nose normal.     Mouth/Throat:     Mouth: Mucous membranes are moist.     Pharynx: Oropharynx is clear. No oropharyngeal exudate or posterior oropharyngeal erythema.  Eyes:     General: No scleral icterus.       Right eye: No discharge.        Left eye: No discharge.     Conjunctiva/sclera: Conjunctivae normal.     Pupils: Pupils are equal, round, and reactive to light.  Neck:  Vascular: No carotid bruit.  Cardiovascular:     Rate and Rhythm: Normal rate and regular rhythm.     Pulses: Normal pulses.     Heart sounds: Normal heart sounds.  Pulmonary:     Effort: Pulmonary effort is normal. No respiratory distress.     Breath sounds: Normal breath sounds.  Abdominal:     General: Abdomen is flat. Bowel sounds are normal.     Palpations: Abdomen is soft.  Musculoskeletal:        General: Normal range of motion.     Cervical back: Normal range of motion.  Skin:    General: Skin is warm and dry.     Capillary Refill: Capillary refill takes less than 2 seconds.  Neurological:     General: No focal deficit present.     Mental Status: He is alert and oriented to person, place, and time. Mental status is at baseline.  Psychiatric:        Mood and  Affect: Mood normal.        Behavior: Behavior normal.        Thought Content: Thought content normal.        Judgment: Judgment normal.      Impression and Plan:  Encounter for preventive health examination - Plan: Hemoglobin A1c  Hypercholesteremia - Plan: CBC with Differential/Platelet, Comprehensive metabolic panel, Lipid panel, PSA, TSH, Vitamin B12, Vitamin D, 25-hydroxy  Cigarette nicotine dependence without complication - Plan: Ambulatory Referral Lung Cancer Screening [facility]  Mass of hard palate - Plan: Ambulatory referral to ENT  -Recommend routine eye and dental care. -Healthy lifestyle discussed in detail. -Labs to be updated today. -Prostate cancer screening: Has a remote history of prostate cancer and now follows with urology yearly in August. Health Maintenance  Topic Date Due   HIV Screening  Never done   Hepatitis C Screening: USPSTF Recommendation to screen - Ages 25-79 yo.  Never done   COVID-19 Vaccine (6 - 2023-24 season) 04/02/2022*   DTaP/Tdap/Td vaccine (2 - Td or Tdap) 02/01/2027   Colon Cancer Screening  09/22/2031   Flu Shot  Completed   Zoster (Shingles) Vaccine  Completed   HPV Vaccine  Aged Out  *Topic was postponed. The date shown is not the original due date.   I have discussed tobacco cessation with the patient.  I have counseled the patient regarding the negative impacts of continued tobacco use including but not limited to lung cancer, COPD, and cardiovascular disease.  I have discussed alternatives to tobacco and modalities that may help facilitate tobacco cessation including but not limited to biofeedback, hypnosis, and medications.  Total time spent with tobacco counseling was 4 minutes.  He remains in the precontemplative stage and we will continue to address at subsequent visits.  -I remain concerned about his hard palate mass.  Have drilled into him importance about following through with ENT referral. -Referral placed to the  [place] lung cancer screening program due to his ongoing smoking history.   Time spent:36 minutes reviewing chart, interviewing and examining patient and formulating plan of care.  This time is independent of time spent for smoking cessation counseling     Daveda Larock Isaac Bliss, MD [facility]

## 2022-03-18 ENCOUNTER — Other Ambulatory Visit (INDEPENDENT_AMBULATORY_CARE_PROVIDER_SITE_OTHER): Payer: No Typology Code available for payment source

## 2022-03-18 DIAGNOSIS — E7800 Pure hypercholesterolemia, unspecified: Secondary | ICD-10-CM

## 2022-03-18 DIAGNOSIS — Z0000 Encounter for general adult medical examination without abnormal findings: Secondary | ICD-10-CM | POA: Diagnosis not present

## 2022-03-18 LAB — LIPID PANEL
Cholesterol: 206 mg/dL — ABNORMAL HIGH (ref 0–200)
HDL: 71.9 mg/dL (ref >39.00)
Total CHOL/HDL Ratio: 3
Triglycerides: 540 mg/dL — ABNORMAL HIGH (ref 0.0–149.0)

## 2022-03-18 LAB — CBC WITH DIFFERENTIAL/PLATELET
Basophils Absolute: 0 10*3/uL (ref 0.0–0.1)
Basophils Relative: 1.1 % (ref 0.0–3.0)
Eosinophils Absolute: 0.1 10*3/uL (ref 0.0–0.7)
Eosinophils Relative: 1.8 % (ref 0.0–5.0)
HCT: 36.6 % — ABNORMAL LOW (ref 39.0–52.0)
Hemoglobin: 12.7 g/dL — ABNORMAL LOW (ref 13.0–17.0)
Lymphocytes Relative: 41.5 % (ref 12.0–46.0)
Lymphs Abs: 1.6 10*3/uL (ref 0.7–4.0)
MCHC: 34.8 g/dL (ref 30.0–36.0)
MCV: 101.9 fl — ABNORMAL HIGH (ref 78.0–100.0)
Monocytes Absolute: 0.4 10*3/uL (ref 0.1–1.0)
Monocytes Relative: 10.9 % (ref 3.0–12.0)
Neutro Abs: 1.7 10*3/uL (ref 1.4–7.7)
Neutrophils Relative %: 44.7 % (ref 43.0–77.0)
Platelets: 304 10*3/uL (ref 150.0–400.0)
RBC: 3.59 Mil/uL — ABNORMAL LOW (ref 4.22–5.81)
RDW: 12.3 % (ref 11.5–15.5)
WBC: 3.9 10*3/uL — ABNORMAL LOW (ref 4.0–10.5)

## 2022-03-18 LAB — COMPREHENSIVE METABOLIC PANEL
ALT: 25 U/L (ref 0–53)
AST: 40 U/L — ABNORMAL HIGH (ref 0–37)
Albumin: 4.5 g/dL (ref 3.5–5.2)
Alkaline Phosphatase: 80 U/L (ref 39–117)
BUN: 10 mg/dL (ref 6–23)
CO2: 25 mEq/L (ref 19–32)
Calcium: 9.9 mg/dL (ref 8.4–10.5)
Chloride: 102 mEq/L (ref 96–112)
Creatinine, Ser: 1.06 mg/dL (ref 0.40–1.50)
GFR: 79.38 mL/min (ref >60.00)
Glucose, Bld: 103 mg/dL — ABNORMAL HIGH (ref 70–99)
Potassium: 4.8 mEq/L (ref 3.5–5.1)
Sodium: 137 mEq/L (ref 135–145)
Total Bilirubin: 0.4 mg/dL (ref 0.2–1.2)
Total Protein: 7.3 g/dL (ref 6.0–8.3)

## 2022-03-18 LAB — HEMOGLOBIN A1C: Hgb A1c MFr Bld: 5.6 % (ref 4.6–6.5)

## 2022-03-18 LAB — TSH: TSH: 1.46 u[IU]/mL (ref 0.35–5.50)

## 2022-03-18 LAB — LDL CHOLESTEROL, DIRECT: Direct LDL: 83 mg/dL

## 2022-03-18 LAB — VITAMIN D 25 HYDROXY (VIT D DEFICIENCY, FRACTURES): VITD: 52.49 ng/mL (ref 30.00–100.00)

## 2022-03-18 LAB — VITAMIN B12: Vitamin B-12: 339 pg/mL (ref 211–911)

## 2022-03-18 LAB — PSA: PSA: 0.27 ng/mL (ref 0.10–4.00)

## 2022-03-21 ENCOUNTER — Other Ambulatory Visit: Payer: Self-pay | Admitting: Internal Medicine

## 2022-03-21 DIAGNOSIS — E7800 Pure hypercholesterolemia, unspecified: Secondary | ICD-10-CM

## 2022-06-24 ENCOUNTER — Encounter: Payer: Self-pay | Admitting: Emergency Medicine

## 2022-07-05 ENCOUNTER — Other Ambulatory Visit (INDEPENDENT_AMBULATORY_CARE_PROVIDER_SITE_OTHER): Payer: No Typology Code available for payment source

## 2022-07-05 DIAGNOSIS — E7800 Pure hypercholesterolemia, unspecified: Secondary | ICD-10-CM | POA: Diagnosis not present

## 2022-07-05 LAB — LIPID PANEL
Cholesterol: 183 mg/dL (ref 0–200)
HDL: 67.1 mg/dL (ref >39.00)
NonHDL: 115.52
Total CHOL/HDL Ratio: 3
Triglycerides: 265 mg/dL — ABNORMAL HIGH (ref 0.0–149.0)
VLDL: 53 mg/dL — ABNORMAL HIGH (ref 0.0–40.0)

## 2022-07-05 LAB — LDL CHOLESTEROL, DIRECT: Direct LDL: 71 mg/dL

## 2022-07-12 ENCOUNTER — Other Ambulatory Visit: Payer: Self-pay | Admitting: Internal Medicine

## 2022-07-12 DIAGNOSIS — E7800 Pure hypercholesterolemia, unspecified: Secondary | ICD-10-CM

## 2023-03-09 ENCOUNTER — Encounter: Payer: Self-pay | Admitting: *Deleted
